# Patient Record
Sex: Male | Born: 1970 | Race: White | Hispanic: No | Marital: Married | Smoking: Never smoker
Health system: Southern US, Community
[De-identification: ages and names within clinical notes are randomized; demographics above are authoritative.]

## PROBLEM LIST (undated history)

## (undated) ENCOUNTER — Emergency Department (HOSPITAL_COMMUNITY): Admission: EM | Payer: BC Managed Care – PPO | Source: Home / Self Care

## (undated) DIAGNOSIS — F419 Anxiety disorder, unspecified: Secondary | ICD-10-CM

## (undated) DIAGNOSIS — K625 Hemorrhage of anus and rectum: Secondary | ICD-10-CM

## (undated) HISTORY — DX: Anxiety disorder, unspecified: F41.9

## (undated) HISTORY — DX: Hemorrhage of anus and rectum: K62.5

---

## 2008-10-19 ENCOUNTER — Emergency Department (HOSPITAL_COMMUNITY): Admission: EM | Admit: 2008-10-19 | Discharge: 2008-10-19 | Payer: Self-pay | Admitting: Emergency Medicine

## 2008-10-29 ENCOUNTER — Emergency Department (HOSPITAL_COMMUNITY): Admission: EM | Admit: 2008-10-29 | Discharge: 2008-10-29 | Payer: Self-pay | Admitting: Family Medicine

## 2011-07-16 ENCOUNTER — Emergency Department (HOSPITAL_COMMUNITY)
Admission: EM | Admit: 2011-07-16 | Discharge: 2011-07-16 | Disposition: A | Discharge status: Home / Self Care | Payer: BC Managed Care – PPO | Attending: Emergency Medicine | Admitting: Emergency Medicine

## 2011-07-16 ENCOUNTER — Emergency Department (HOSPITAL_COMMUNITY): Payer: BC Managed Care – PPO

## 2011-07-16 ENCOUNTER — Other Ambulatory Visit: Payer: Self-pay

## 2011-07-16 ENCOUNTER — Encounter: Payer: Self-pay | Admitting: *Deleted

## 2011-07-16 DIAGNOSIS — M549 Dorsalgia, unspecified: Secondary | ICD-10-CM

## 2011-07-16 DIAGNOSIS — M545 Low back pain, unspecified: Secondary | ICD-10-CM | POA: Insufficient documentation

## 2011-07-16 DIAGNOSIS — R079 Chest pain, unspecified: Secondary | ICD-10-CM | POA: Insufficient documentation

## 2011-07-16 LAB — POCT I-STAT, CHEM 8
Chloride: 102 mEq/L (ref 96–112)
Creatinine, Ser: 0.9 mg/dL (ref 0.50–1.35)
Glucose, Bld: 90 mg/dL (ref 70–99)
Hemoglobin: 15.6 g/dL (ref 13.0–17.0)
Potassium: 4 mEq/L (ref 3.5–5.1)

## 2011-07-16 MED ORDER — HYDROCODONE-ACETAMINOPHEN 5-325 MG PO TABS: 1.0000 | ORAL_TABLET | ORAL | Status: AC | PRN

## 2011-07-16 NOTE — ED Provider Notes (Signed)
History     CSN: [id number]  Arrival date & time 07/16/11  1040   First MD Initiated Contact with Patient 07/16/11 1136      Chief Complaint  Patient presents with  . Chest Pain    (Consider location/radiation/quality/duration/timing/severity/associated sxs/prior treatment) Patient is a 40 y.o. male presenting with chest pain. The history is provided by the patient.  Chest Pain The chest pain began 2 days ago. Chest pain occurs intermittently. The chest pain is unchanged. Associated with: He started taking Flexeril and Toradol 2 days ago prescribed by Urgent Care for back pain and has experienced intermittent chest pain since. Chest pain last night woke him from sleep and radiated into shoulder. The quality of the pain is described as sharp. Pertinent negatives for primary symptoms include no fever, no shortness of breath, no cough and no nausea.     History reviewed. No pertinent past medical history.  History reviewed. No pertinent past surgical history.  History reviewed. No pertinent family history.  History  Substance Use Topics  . Smoking status: Never Smoker   . Smokeless tobacco: Not on file  . Alcohol Use: Yes      Review of Systems  Constitutional: Negative for fever and chills.  HENT: Negative.   Respiratory: Negative.  Negative for cough and shortness of breath.   Cardiovascular: Positive for chest pain.  Gastrointestinal: Negative.  Negative for nausea.  Musculoskeletal: Negative.   Skin: Negative.   Neurological: Negative.     Allergies  Sulfa antibiotics  Home Medications   Current Outpatient Rx  Name Route Sig Dispense Refill  . VITAMIN C PO Oral Take 1 tablet by mouth daily.      . ADULT MULTIVITAMIN W/MINERALS CH Oral Take 1 tablet by mouth daily.        BP 121/80  Pulse 78  Temp(Src) 98 F (36.7 C) (Oral)  Resp 17  SpO2 100%  Physical Exam  Constitutional: He appears well-developed and well-nourished.  HENT:  Head: Normocephalic.   Neck: Normal range of motion. Neck supple. Carotid bruit is not present.  Cardiovascular: Normal rate and regular rhythm.   Pulmonary/Chest: Effort normal and breath sounds normal.  Abdominal: Soft. Bowel sounds are normal. There is no tenderness. There is no rebound and no guarding.  Musculoskeletal: Normal range of motion. He exhibits no edema and no tenderness.       Mild paralumbar tenderness without swelling or palpable spasm.  Neurological: He is alert. No cranial nerve deficit.  Skin: Skin is warm and dry. No rash noted.  Psychiatric: He has a normal mood and affect.    ED Course  Procedures (including critical care time)   Labs Reviewed  POCT I-STAT, CHEM 8  POCT I-STAT TROPONIN I  I-STAT, CHEM 8  I-STAT TROPONIN I   Dg Chest 2 View  07/16/2011  *RADIOLOGY REPORT*  Clinical Data: 40 year old male with left chest pain.  CHEST - 2 VIEW  Comparison: None  Findings: The cardiomediastinal silhouette is unremarkable. The lungs are clear. There is no evidence of focal airspace disease, pulmonary edema, pulmonary nodule/mass, pleural effusion, or pneumothorax. No acute bony abnormalities are identified.  IMPRESSION: No evidence of active cardiopulmonary disease.  Original Report Authenticated By: Rosendo Gros, M.D.     No diagnosis found.    MDM  Pain atypical for cardiac pain with no risk factors. Neg enzymes, normal EKG and CXR. He can be discharged home to follow up with his doctor.  Rodena Medin, PA 07/16/11 1243  Rodena Medin, PA 07/16/11 1245  Rodena Medin, PA 07/16/11 1246

## 2011-07-16 NOTE — ED Notes (Signed)
Pt had some back strain and went to an urgent care where he was placed on toradol and flexeril. He states that last night he had some intermittent left sided chest pain with some radiation down his left arm. He denies any other s/s. No other meds pta. Pt states that he was having such intense left sided chest pain last night and early this morning that it woke him from sleep. Pt read his drug information pamphlet which told him that if he experienced any of the above s/s that he should call prescribing md. He called md's office and they informed him to come here for evaluation. Alert and oriented. No acute distress noted.

## 2011-07-16 NOTE — ED Notes (Signed)
Pt reports he went to urgent care for back pain and was rx toradol and flexeril, states started taking on Monday and experienced intermittent left sided chest pain radiating into left arm. Pt reports pain woke him from his sleep. Denies any associated symptoms.

## 2011-07-16 NOTE — ED Provider Notes (Signed)
Medical screening examination/treatment/procedure(s) were performed by non-physician practitioner and as supervising physician I was immediately available for consultation/collaboration.  Nicholes Stairs, MD 07/16/11 2001

## 2013-01-02 IMAGING — CR DG CHEST 2V
2 series · 2 of 2 positions shown · non-contrast
Comparison: None

CLINICAL DATA: 40-year-old male with left chest pain.

CHEST - 2 VIEW

[w chest pa]
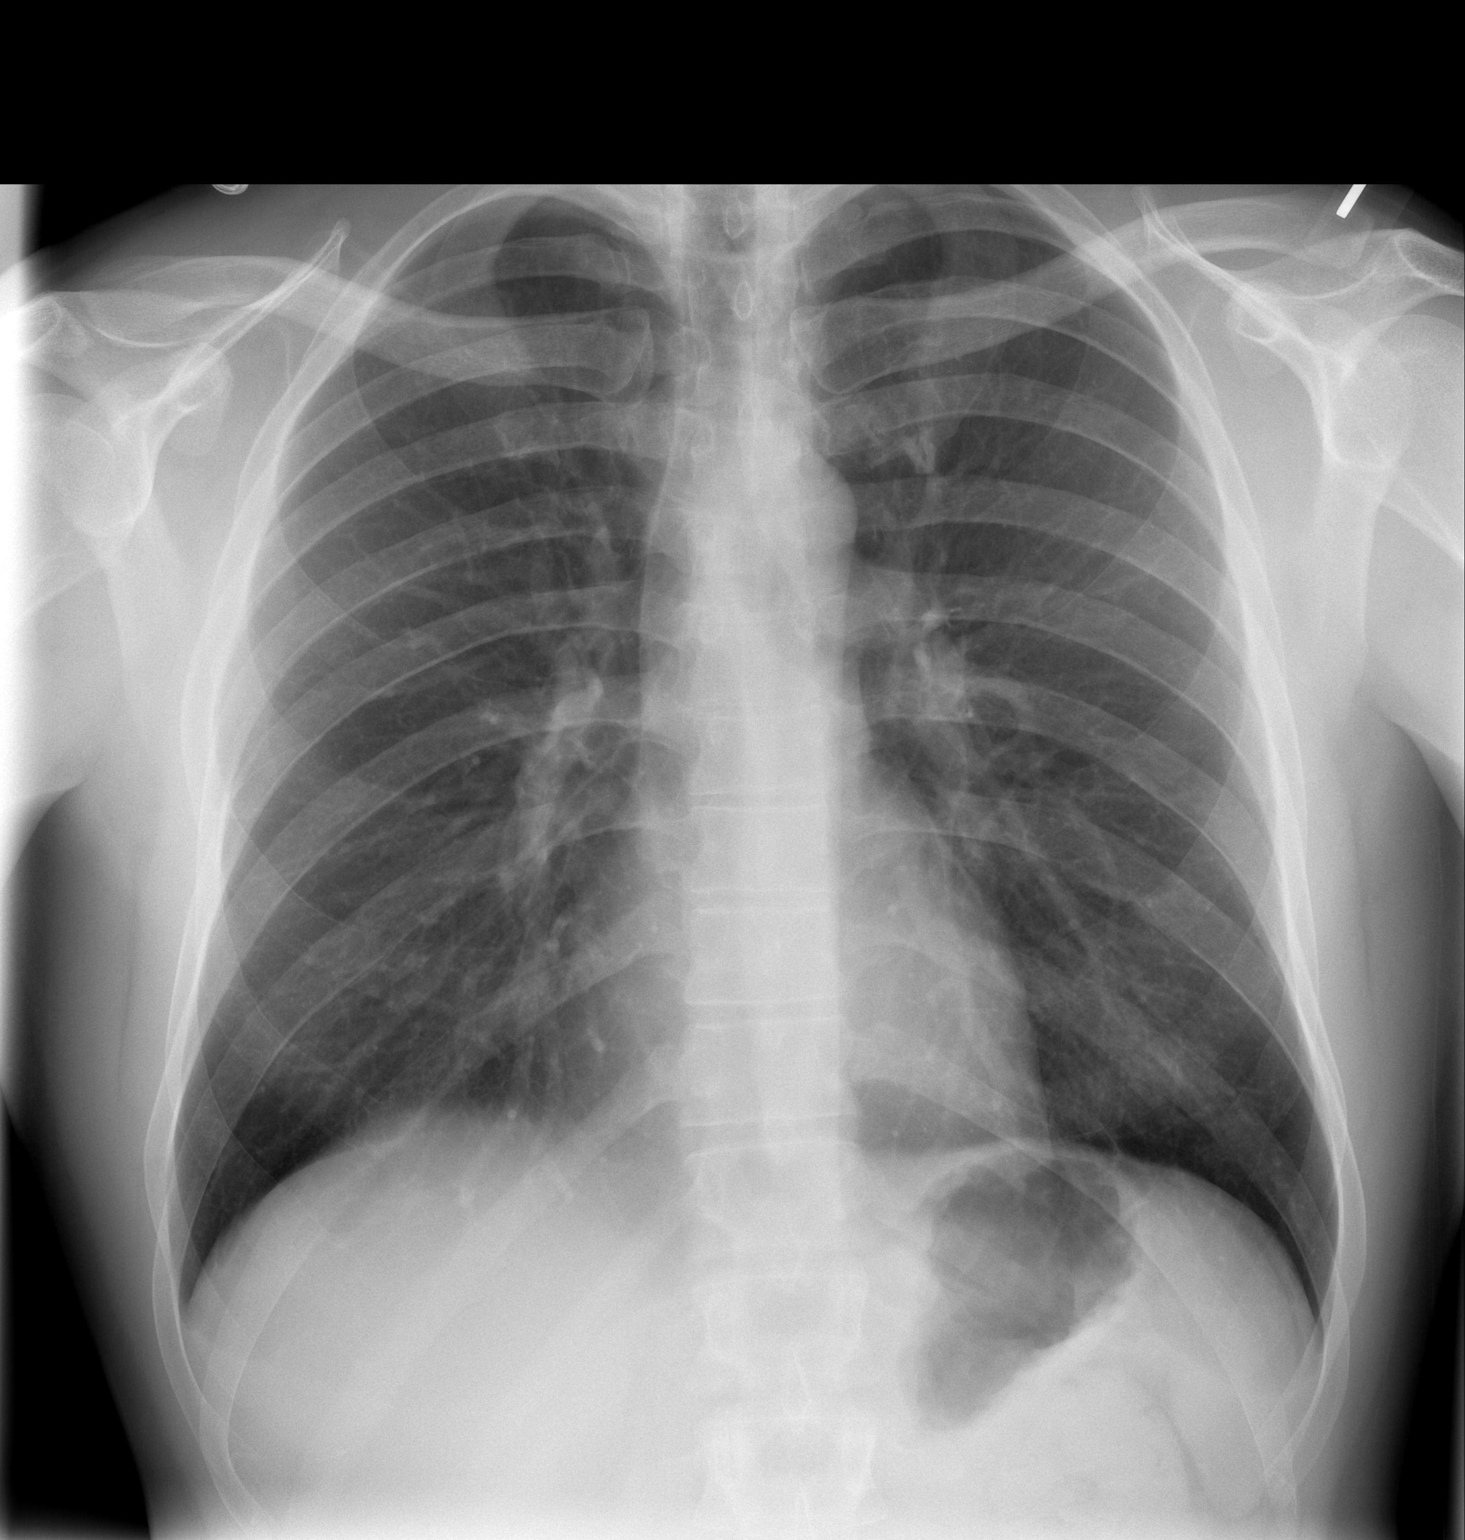

[w chest lat]
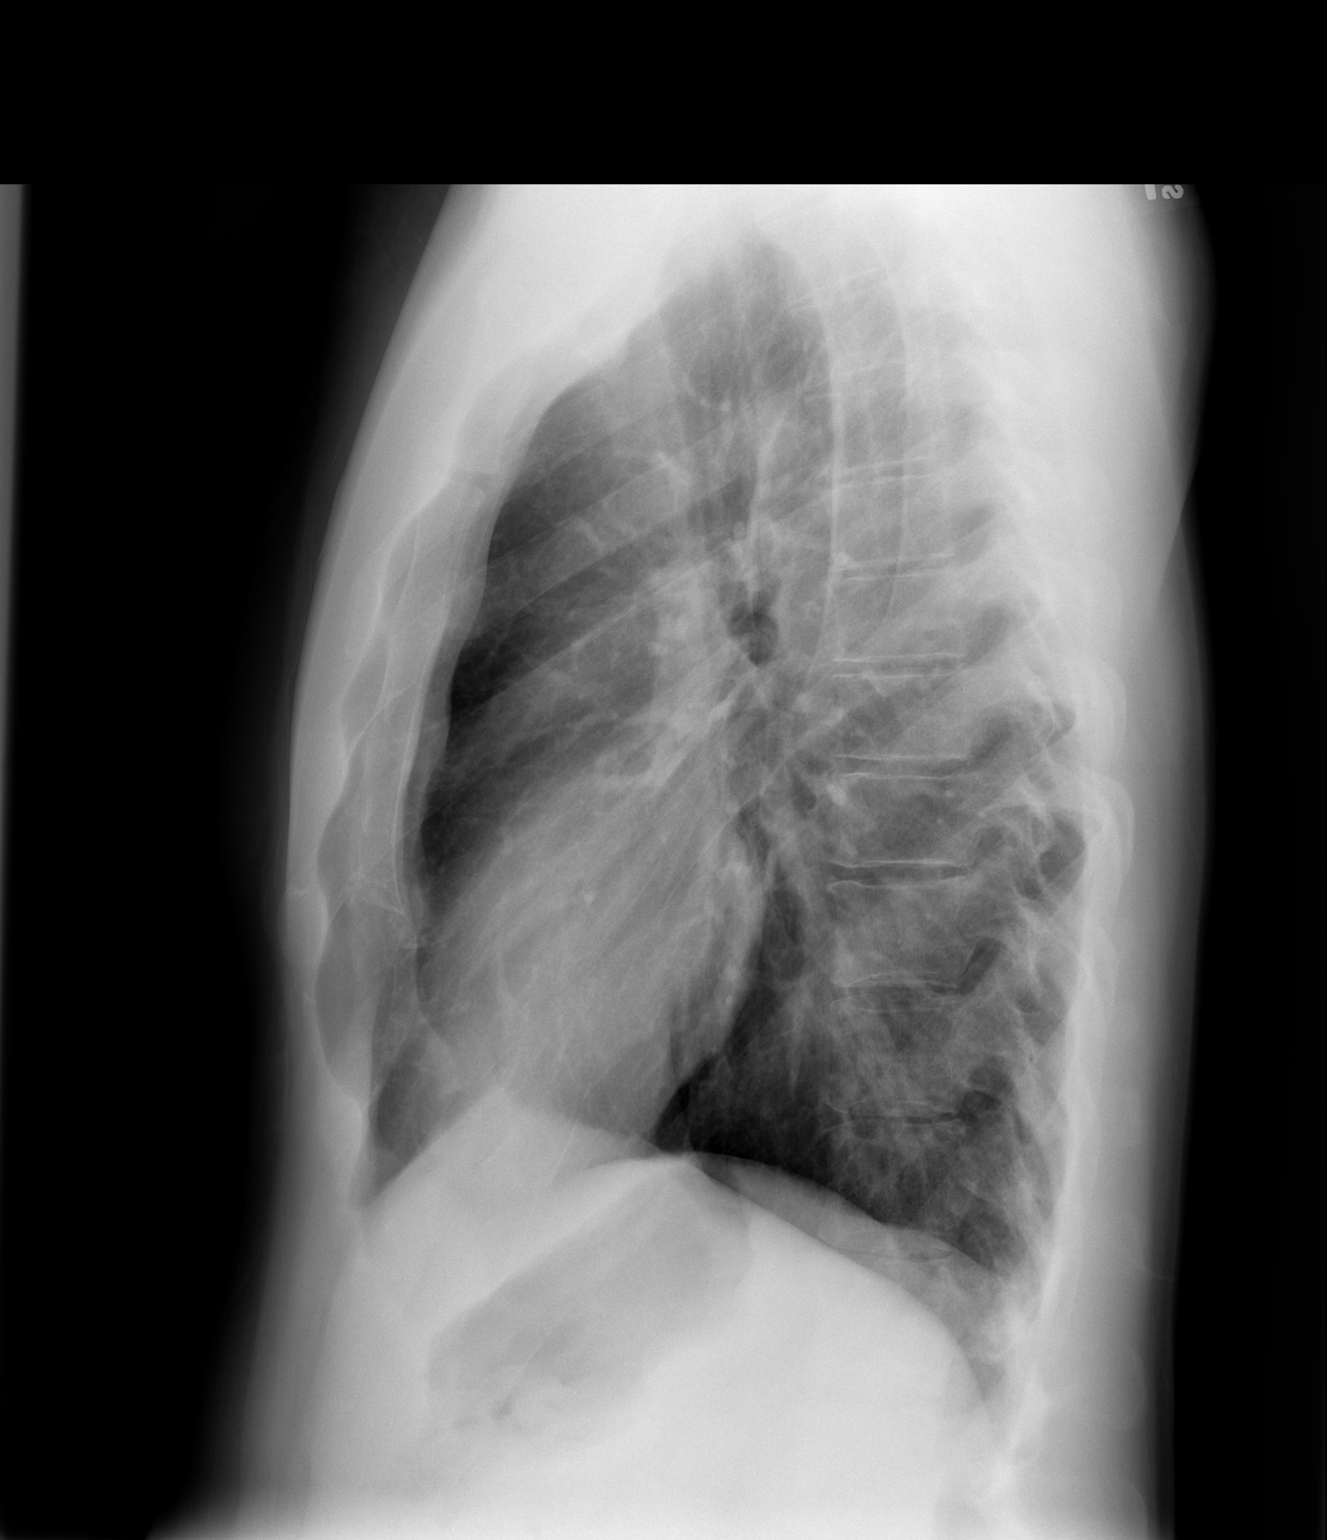

[2 of 2 positions shown; findings below may reference images not displayed]

FINDINGS: The cardiomediastinal silhouette is unremarkable.
The lungs are clear.
There is no evidence of focal airspace disease, pulmonary edema,
pulmonary nodule/mass, pleural effusion, or pneumothorax.
No acute bony abnormalities are identified.
IMPRESSION: No evidence of active cardiopulmonary disease.

## 2020-06-27 ENCOUNTER — Encounter: Payer: Self-pay | Admitting: Adult Health

## 2020-06-27 ENCOUNTER — Ambulatory Visit (INDEPENDENT_AMBULATORY_CARE_PROVIDER_SITE_OTHER): Payer: BC Managed Care – PPO | Admitting: Adult Health

## 2020-06-27 ENCOUNTER — Other Ambulatory Visit: Payer: Self-pay

## 2020-06-27 VITALS — BP 149/85 | HR 104 | Ht 72.0 in | Wt 160.0 lb

## 2020-06-27 DIAGNOSIS — F411 Generalized anxiety disorder: Secondary | ICD-10-CM | POA: Diagnosis not present

## 2020-06-27 MED ORDER — PROPRANOLOL HCL 10 MG PO TABS: 10.0000 mg | ORAL_TABLET | Freq: Three times a day (TID) | ORAL | 2 refills | Status: DC

## 2020-06-27 NOTE — Progress Notes (Signed)
[facility] MD/PA/NP Initial Note  06/27/2020 2:02 PM Walter Abbott  MRN:  [id number]  Chief Complaint:   HPI:   Describes mood today as "ok". Pleasant. Mood symptoms - reports some depression. Denies irritability. Feels more anxious overall. Having a difficult time with "body odor" in the work setting. Feels like it is related to anxiety. 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 Has always been challenged to speak in front of others. Has tried really hard to work through it. Has seen a counselor, psychologist, and family doctor with no remittance. The symptoms are predominantly in the work setting. Stating "it's embarrassing". When he gets nervous, he sweats and "stinks" of the office. Feels like his coworkers are Hydrologist" about it, but does not like putting them in that position. Stable interest and motivation. Taking medications as prescribed.  Energy levels stable. Active, does not have a regular exercise routine.  Enjoys some usual interests and activities. Married. Lives with wife of 15 years and 2 children. Spending time with family. Appetite adequate.  Weight stable - 160 pounds. Working on weight loss - 8 pounds. Sleeps well most nights. Averages 6.5 to 7 hours. "Focus and concentration stable - "borderline symptoms". Completing tasks. Managing aspects of household. Works full-time - 50 to 55 hours. Denies SI or HI.  Denies AH or VH. Diagnosed with ADHD in 2016 - trialed on Adderall.  Previous medication trials: Adderall   Visit Diagnosis:    ICD-10-CM   1. Generalized anxiety disorder  F41.1 propranolol (INDERAL) 10 MG tablet    Past Psychiatric History: Denies psychiatric hospitalization.  Past Medical History: No past medical history on file. No past surgical history on file.  Family Psychiatric History: Denies any family history of mental illness.  Family History: No family history on file.  Social History:  Social History   Socioeconomic History   . Marital status: Married    Spouse name: Not on file  . Number of children: Not on file  . Years of education: Not on file  . Highest education level: Not on file  Occupational History  . Not on file  Tobacco Use  . Smoking status: Never Smoker  . Smokeless tobacco: Never Used  Substance and Sexual Activity  . Alcohol use: Yes  . Drug use: Not on file  . Sexual activity: Not on file  Other Topics Concern  . Not on file  Social History Narrative  . Not on file   Social Determinants of Health   Financial Resource Strain: Not on file  Food Insecurity: Not on file  Transportation Needs: Not on file  Physical Activity: Not on file  Stress: Not on file  Social Connections: Not on file    Allergies:  Allergies  Allergen Reactions  . Sulfa Antibiotics Other (See Comments)    Family history of sulfa allergy    Metabolic Disorder Labs: No results found for: HGBA1C, MPG No results found for: PROLACTIN No results found for: CHOL, TRIG, HDL, CHOLHDL, VLDL, LDLCALC No results found for: TSH  Therapeutic Level Labs: No results found for: LITHIUM No results found for: VALPROATE No components found for:  CBMZ  Current Medications: Current Outpatient Medications  Medication Sig Dispense Refill  . Ascorbic Acid (VITAMIN C PO) Take 1 tablet by mouth daily.      . Multiple Vitamin (MULITIVITAMIN WITH MINERALS) TABS Take 1 tablet by mouth daily.      . propranolol (INDERAL) 10 MG tablet Take 1 tablet (10 mg total)  by mouth 3 (three) times daily. 90 tablet 2   No current facility-administered medications for this visit.    Medication Side Effects: none  Orders placed this visit:  No orders of the defined types were placed in this encounter.   Psychiatric Specialty Exam:  Review of Systems  Musculoskeletal: Negative for gait problem.  Neurological: Negative for tremors.  Psychiatric/Behavioral:       Please refer to HPI    Blood pressure (!) 149/85, pulse (!) 104,  height 6' (1.829 m), weight 160 lb (72.6 kg).Body mass index is 21.7 kg/m.  General Appearance: Casual, Neat and Well Groomed  Eye Contact:  Good  Speech:  Clear and Coherent and Normal Rate  Volume:  Normal  Mood:  Anxious and Depressed  Affect:  Appropriate and Congruent  Thought Process:  Coherent and Descriptions of Associations: Intact  Orientation:  Full (Time, Place, and Person)  Thought Content: Logical   Suicidal Thoughts:  No  Homicidal Thoughts:  No  Memory:  WNL  Judgement:  Good  Insight:  Good  Psychomotor Activity:  Normal  Concentration:  Concentration: Good  Recall:  Good  Fund of Knowledge: Good  Language: Good  Assets:  Communication Skills Desire for Improvement Financial Resources/Insurance Housing Intimacy Leisure Time Physical Health Resilience Social Support Talents/Skills Transportation Vocational/Educational  ADL's:  Intact  Cognition: WNL  Prognosis:  Good   Screenings:   Receiving Psychotherapy: No   Treatment Plan/Recommendations:   Plan:  PDMP reviewed  1. Add Propanolol 10mg  up to 3 times daily as needed for anxiety.  Consider Clonazepam  BP today 149/85 - 104  Read and reviewed note with patient for accuracy.   RTC 4 weeks  Patient advised to contact office with any questions, adverse effects, or acute worsening in signs and symptoms.      , NP

## 2020-07-06 ENCOUNTER — Other Ambulatory Visit: Payer: Self-pay

## 2020-07-06 DIAGNOSIS — Z20822 Contact with and (suspected) exposure to covid-19: Secondary | ICD-10-CM

## 2020-07-09 LAB — NOVEL CORONAVIRUS, NAA: SARS-CoV-2, NAA: NOT DETECTED

## 2020-07-25 ENCOUNTER — Encounter: Payer: Self-pay | Admitting: Adult Health

## 2020-07-25 ENCOUNTER — Other Ambulatory Visit: Payer: Self-pay

## 2020-07-25 ENCOUNTER — Ambulatory Visit (INDEPENDENT_AMBULATORY_CARE_PROVIDER_SITE_OTHER): Payer: BC Managed Care – PPO | Admitting: Adult Health

## 2020-07-25 DIAGNOSIS — F411 Generalized anxiety disorder: Secondary | ICD-10-CM

## 2020-07-25 MED ORDER — CLONAZEPAM 0.5 MG PO TABS: 0.5000 mg | ORAL_TABLET | Freq: Every day | ORAL | 2 refills | Status: DC | PRN

## 2020-07-25 NOTE — Progress Notes (Signed)
Walter Abbott [id number] 09/24/1970 50 y.o.  Subjective:   Patient ID:  Walter Abbott is a 50 y.o. (DOB 27-Jun-1971) male.  Chief Complaint: No chief complaint on file.   HPI Walter Abbott presents to the office today for follow-up of GAD.  Describes mood today as "ok". Pleasant. Mood symptoms - "some" depression. Denies irritability. Still feeling more anxious overall. Felt like the Propranolol was helpful, but did not tolerate due to GI issues. Continues to have "body odor" in the work setting - sometimes at home, but rare. Would like to try another medication to manage symptoms. Stable interest and motivation. Taking medications as prescribed.  Energy levels stable. Active, does not have a regular exercise routine.  Enjoys some usual interests and activities. Married. Lives with wife of 15 years and 2 children. Spending time with family. Appetite adequate.  Weight stable - 160 pounds.  Sleeps well most nights. Averages 6.5 to 7 hours. Focus and concentration stable - "having to work on it". Completing tasks. Managing aspects of household. Works full-time - 50 to 55 hours. Denies SI or HI.  Denies AH or VH. Diagnosed with ADHD in 2016 - trialed on Adderall.  Previous medication trials: Adderall, Propranolol - GI upset  Review of Systems:  Review of Systems  Musculoskeletal: Negative for gait problem.  Neurological: Negative for tremors.  Psychiatric/Behavioral:       Please refer to HPI    Medications: I have reviewed the patient's current medications.  Current Outpatient Medications  Medication Sig Dispense Refill  . clonazePAM (KLONOPIN) 0.5 MG tablet Take 1 tablet (0.5 mg total) by mouth daily as needed for anxiety. 30 tablet 2  . Ascorbic Acid (VITAMIN C PO) Take 1 tablet by mouth daily.      . Multiple Vitamin (MULITIVITAMIN WITH MINERALS) TABS Take 1 tablet by mouth daily.      . propranolol (INDERAL) 10 MG tablet Take 1 tablet (10 mg total) by mouth 3 (three)  times daily. 90 tablet 2   No current facility-administered medications for this visit.    Medication Side Effects: None  Allergies:  Allergies  Allergen Reactions  . Sulfa Antibiotics Other (See Comments)    Family history of sulfa allergy    No past medical history on file.  No family history on file.  Social History   Socioeconomic History  . Marital status: Married    Spouse name: Not on file  . Number of children: Not on file  . Years of education: Not on file  . Highest education level: Not on file  Occupational History  . Not on file  Tobacco Use  . Smoking status: Never Smoker  . Smokeless tobacco: Never Used  Substance and Sexual Activity  . Alcohol use: Yes  . Drug use: Not on file  . Sexual activity: Not on file  Other Topics Concern  . Not on file  Social History Narrative  . Not on file   Social Determinants of Health   Financial Resource Strain: Not on file  Food Insecurity: Not on file  Transportation Needs: Not on file  Physical Activity: Not on file  Stress: Not on file  Social Connections: Not on file  Intimate Partner Violence: Not on file    Past Medical History, Surgical history, Social history, and Family history were reviewed and updated as appropriate.   Please see review of systems for further details on the patient's review from today.   Objective:   Physical Exam:  There were no vitals taken for this visit.  Physical Exam Constitutional:      General: He is not in acute distress. Musculoskeletal:        General: No deformity.  Neurological:     Mental Status: He is alert and oriented to person, place, and time.     Coordination: Coordination normal.  Psychiatric:        Attention and Perception: Attention and perception normal. He does not perceive auditory or visual hallucinations.        Mood and Affect: Mood normal. Mood is not anxious or depressed. Affect is not labile, blunt, angry or inappropriate.        Speech:  Speech normal.        Behavior: Behavior normal.        Thought Content: Thought content normal. Thought content is not paranoid or delusional. Thought content does not include homicidal or suicidal ideation. Thought content does not include homicidal or suicidal plan.        Cognition and Memory: Cognition and memory normal.        Judgment: Judgment normal.     Comments: Insight intact     Lab Review:     Component Value Date/Time   NA 142 07/16/2011 1214   K 4.0 07/16/2011 1214   CL 102 07/16/2011 1214   GLUCOSE 90 07/16/2011 1214   BUN 17 07/16/2011 1214   CREATININE 0.90 07/16/2011 1214       Component Value Date/Time   HGB 15.6 07/16/2011 1214   HCT 46.0 07/16/2011 1214    No results found for: POCLITH, LITHIUM   No results found for: PHENYTOIN, PHENOBARB, VALPROATE, CBMZ   .res Assessment: Plan:    Plan:  PDMP reviewed  1. D/C Propanolol 10mg  up to 3 times daily as needed for anxiety. 2. Add Clonazepam 0.5mg  daily as needed.  BP today 149/85 - 104  Read and reviewed note with patient for accuracy.   RTC 4 weeks  Patient advised to contact office with any questions, adverse effects, or acute worsening in signs and symptoms.   Diagnoses and all orders for this visit:  Generalized anxiety disorder -     clonazePAM (KLONOPIN) 0.5 MG tablet; Take 1 tablet (0.5 mg total) by mouth daily as needed for anxiety.     Please see After Visit Summary for patient specific instructions.  No future appointments.  No orders of the defined types were placed in this encounter.   -------------------------------

## 2020-08-14 DIAGNOSIS — M545 Low back pain, unspecified: Secondary | ICD-10-CM | POA: Insufficient documentation

## 2020-08-22 ENCOUNTER — Telehealth (INDEPENDENT_AMBULATORY_CARE_PROVIDER_SITE_OTHER): Payer: BC Managed Care – PPO | Admitting: Adult Health

## 2020-08-22 ENCOUNTER — Encounter: Payer: Self-pay | Admitting: Adult Health

## 2020-08-22 DIAGNOSIS — F411 Generalized anxiety disorder: Secondary | ICD-10-CM

## 2020-08-22 MED ORDER — CLONAZEPAM 0.5 MG PO TABS: 0.5000 mg | ORAL_TABLET | Freq: Three times a day (TID) | ORAL | 2 refills | Status: DC | PRN

## 2020-08-22 NOTE — Progress Notes (Addendum)
Walter Abbott [id number] Apr 30, 1971 50 y.o.  Virtual Visit via Video Note  I connected with pt @ on 08/22/20 at  1:00 PM EST by a video enabled telemedicine application and verified that I am speaking with the correct person using two identifiers.   I discussed the limitations of evaluation and management by telemedicine and the availability of in person appointments. The patient expressed understanding and agreed to proceed.  I discussed the assessment and treatment plan with the patient. The patient was provided an opportunity to ask questions and all were answered. The patient agreed with the plan and demonstrated an understanding of the instructions.   The patient was advised to call back or seek an in-person evaluation if the symptoms worsen or if the condition fails to improve as anticipated.  I provided 20 minutes of non-face-to-face time during this encounter.  The patient was located at home.  The provider was located at [facility].   Dorothyann Gibbs, NP   Subjective:   Patient ID:  Walter Abbott is a 50 y.o. (DOB Aug 22, 1970) male.  Chief Complaint: No chief complaint on file.   HPI LANNIS LICHTENWALNER presents for follow-up of GAD.  Describes mood today as "ok". Pleasant. Mood symptoms - denies depression and irritability. Decreased anxiety with addition of Clonazepam. Doing better in the office setting. Still having issues with body odor, but feels it is a lot better. 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 Married. Lives with wife of 15 years and 2 children. Spending time with family. Appetite adequate.  Weight stable - 160 pounds.  Sleeps well most nights. Averages 6 to 7 hours. Focus and concentration "good". Completing tasks. Managing aspects of household. Works full-time - 50 to 55 hours. Denies SI or HI.  Denies AH or VH. Diagnosed with ADHD in 2016 - trialed on Adderall.  Previous medication trials: Adderall, Propranolol - GI  upset    Review of Systems:  Review of Systems  Musculoskeletal: Negative for gait problem.  Neurological: Negative for tremors.  Psychiatric/Behavioral:       Please refer to HPI    Medications: I have reviewed the patient's current medications.  Current Outpatient Medications  Medication Sig Dispense Refill  . Ascorbic Acid (VITAMIN C PO) Take 1 tablet by mouth daily.      . clonazePAM (KLONOPIN) 0.5 MG tablet Take 1 tablet (0.5 mg total) by mouth 3 (three) times daily as needed for anxiety. 90 tablet 2  . Multiple Vitamin (MULITIVITAMIN WITH MINERALS) TABS Take 1 tablet by mouth daily.      . propranolol (INDERAL) 10 MG tablet Take 1 tablet (10 mg total) by mouth 3 (three) times daily. 90 tablet 2   No current facility-administered medications for this visit.    Medication Side Effects: None  Allergies:  Allergies  Allergen Reactions  . Sulfa Antibiotics Other (See Comments)    Family history of sulfa allergy    No past medical history on file.  No family history on file.  Social History   Socioeconomic History  . Marital status: Married    Spouse name: Not on file  . Number of children: Not on file  . Years of education: Not on file  . Highest education level: Not on file  Occupational History  . Not on file  Tobacco Use  . Smoking status: Never Smoker  . Smokeless tobacco: Never Used  Substance and Sexual Activity  . Alcohol use: Yes  . Drug  use: Not on file  . Sexual activity: Not on file  Other Topics Concern  . Not on file  Social History Narrative  . Not on file   Social Determinants of Health   Financial Resource Strain: Not on file  Food Insecurity: Not on file  Transportation Needs: Not on file  Physical Activity: Not on file  Stress: Not on file  Social Connections: Not on file  Intimate Partner Violence: Not on file    Past Medical History, Surgical history, Social history, and Family history were reviewed and updated as appropriate.    Please see review of systems for further details on the patient's review from today.   Objective:   Physical Exam:  There were no vitals taken for this visit.  Physical Exam Constitutional:      General: He is not in acute distress. Musculoskeletal:        General: No deformity.  Neurological:     Mental Status: He is alert and oriented to person, place, and time.     Coordination: Coordination normal.  Psychiatric:        Attention and Perception: Attention and perception normal. He does not perceive auditory or visual hallucinations.        Mood and Affect: Mood normal. Mood is not anxious or depressed. Affect is not labile, blunt, angry or inappropriate.        Speech: Speech normal.        Behavior: Behavior normal.        Thought Content: Thought content normal. Thought content is not paranoid or delusional. Thought content does not include homicidal or suicidal ideation. Thought content does not include homicidal or suicidal plan.        Cognition and Memory: Cognition and memory normal.        Judgment: Judgment normal.     Comments: Insight intact     Lab Review:     Component Value Date/Time   NA 142 07/16/2011 1214   K 4.0 07/16/2011 1214   CL 102 07/16/2011 1214   GLUCOSE 90 07/16/2011 1214   BUN 17 07/16/2011 1214   CREATININE 0.90 07/16/2011 1214       Component Value Date/Time   HGB 15.6 07/16/2011 1214   HCT 46.0 07/16/2011 1214    No results found for: POCLITH, LITHIUM   No results found for: PHENYTOIN, PHENOBARB, VALPROATE, CBMZ   .res Assessment: Plan:    Plan:  PDMP reviewed  1. Increase Clonazepam 0.5mg  daily to tid as needed.  RTC 4 weeks  Patient advised to contact office with any questions, adverse effects, or acute worsening in signs and symptoms.  Discussed potential benefits, risk, and side effects of benzodiazepines to include potential risk of tolerance and dependence, as well as possible drowsiness.  Advised patient not to  drive if experiencing drowsiness and to take lowest possible effective dose to minimize risk of dependence and tolerance.  Diagnoses and all orders for this visit:  Generalized anxiety disorder -     clonazePAM (KLONOPIN) 0.5 MG tablet; Take 1 tablet (0.5 mg total) by mouth 3 (three) times daily as needed for anxiety.     Please see After Visit Summary for patient specific instructions.  No future appointments.  No orders of the defined types were placed in this encounter.     -------------------------------

## 2020-11-25 ENCOUNTER — Encounter: Payer: Self-pay | Admitting: Adult Health

## 2020-11-25 ENCOUNTER — Other Ambulatory Visit: Payer: Self-pay

## 2020-11-25 ENCOUNTER — Ambulatory Visit (INDEPENDENT_AMBULATORY_CARE_PROVIDER_SITE_OTHER): Payer: BC Managed Care – PPO | Admitting: Adult Health

## 2020-11-25 DIAGNOSIS — F411 Generalized anxiety disorder: Secondary | ICD-10-CM | POA: Diagnosis not present

## 2020-11-25 MED ORDER — FLUOXETINE HCL 20 MG PO CAPS: 20.0000 mg | ORAL_CAPSULE | Freq: Every day | ORAL | 5 refills | Status: DC

## 2020-11-25 NOTE — Progress Notes (Signed)
NYCHOLAS RAYNER [id number] 29-Sep-1970 50 y.o.  Subjective:   Patient ID:  Walter Abbott is a 50 y.o. (DOB 12-19-1970) male.  Chief Complaint: No chief complaint on file.   HPI Walter Abbott presents to the office today for follow-up of GAD.  Describes mood today as "ok". Pleasant. Mood symptoms - denies depression and irritability. Decreased anxiety with Clonazepam. Feeling too sedated on Clonazepam with regular use and would like to start a longer acting medication. Will possibly be changing jobs or returning to the office. He and father talking - has been out of his life for 11 years. Planning to meet later this year. Spending time with family. Appetite adequate.  Weight stable - 160 pounds.  Sleeps well most nights. Averages 6 to 7 hours. Focus and concentration stable. Completing tasks. Managing aspects of household. Works full-time - 45 to 50 hours. Denies SI or HI.  Denies AH or VH. Diagnosed with ADHD in 2016 - trialed on Adderall.  Previous medication trials: Adderall, Propranolol - GI upset,    Review of Systems:  Review of Systems  Musculoskeletal: Negative for gait problem.  Neurological: Negative for tremors.  Psychiatric/Behavioral:       Please refer to HPI    Medications: I have reviewed the patient's current medications.  Current Outpatient Medications  Medication Sig Dispense Refill  . FLUoxetine (PROZAC) 20 MG capsule Take 1 capsule (20 mg total) by mouth daily. 30 capsule 5  . Ascorbic Acid (VITAMIN C PO) Take 1 tablet by mouth daily.      . clonazePAM (KLONOPIN) 0.5 MG tablet Take 1 tablet (0.5 mg total) by mouth 3 (three) times daily as needed for anxiety. 90 tablet 2  . Multiple Vitamin (MULITIVITAMIN WITH MINERALS) TABS Take 1 tablet by mouth daily.      . propranolol (INDERAL) 10 MG tablet Take 1 tablet (10 mg total) by mouth 3 (three) times daily. 90 tablet 2   No current facility-administered medications for this visit.    Medication Side  Effects: None  Allergies:  Allergies  Allergen Reactions  . Sulfa Antibiotics Other (See Comments)    Family history of sulfa allergy    No past medical history on file.  Past Medical History, Surgical history, Social history, and Family history were reviewed and updated as appropriate.   Please see review of systems for further details on the patient's review from today.   Objective:   Physical Exam:  There were no vitals taken for this visit.  Physical Exam Constitutional:      General: He is not in acute distress. Musculoskeletal:        General: No deformity.  Neurological:     Mental Status: He is alert and oriented to person, place, and time.     Coordination: Coordination normal.  Psychiatric:        Attention and Perception: Attention and perception normal. He does not perceive auditory or visual hallucinations.        Mood and Affect: Mood normal. Mood is not anxious or depressed. Affect is not labile, blunt, angry or inappropriate.        Speech: Speech normal.        Behavior: Behavior normal.        Thought Content: Thought content normal. Thought content is not paranoid or delusional. Thought content does not include homicidal or suicidal ideation. Thought content does not include homicidal or suicidal plan.        Cognition and Memory: Cognition  and memory normal.        Judgment: Judgment normal.     Comments: Insight intact     Lab Review:     Component Value Date/Time   NA 142 07/16/2011 1214   K 4.0 07/16/2011 1214   CL 102 07/16/2011 1214   GLUCOSE 90 07/16/2011 1214   BUN 17 07/16/2011 1214   CREATININE 0.90 07/16/2011 1214       Component Value Date/Time   HGB 15.6 07/16/2011 1214   HCT 46.0 07/16/2011 1214    No results found for: POCLITH, LITHIUM   No results found for: PHENYTOIN, PHENOBARB, VALPROATE, CBMZ   .res Assessment: Plan:    Plan:  PDMP reviewed  1.Continue Clonazepam 0.5mg  daily to tid as needed. 2. Add Prozac 20mg   daily  RTC 6/8 weeks  Patient advised to contact office with any questions, adverse effects, or acute worsening in signs and symptoms.  Discussed potential benefits, risk, and side effects of benzodiazepines to include potential risk of tolerance and dependence, as well as possible drowsiness.  Advised patient not to drive if experiencing drowsiness and to take lowest possible effective dose to minimize risk of dependence and tolerance.   Diagnoses and all orders for this visit:  Generalized anxiety disorder -     FLUoxetine (PROZAC) 20 MG capsule; Take 1 capsule (20 mg total) by mouth daily.     Please see After Visit Summary for patient specific instructions.  Future Appointments  Date Time Provider Department Center  01/06/2021  9:40 AM Jermie Hippe, 01/08/2021, NP [facility] None    No orders of the defined types were placed in this encounter.   -------------------------------

## 2020-12-05 ENCOUNTER — Other Ambulatory Visit: Payer: Self-pay

## 2020-12-05 ENCOUNTER — Encounter: Payer: Self-pay | Admitting: Podiatry

## 2020-12-05 ENCOUNTER — Ambulatory Visit (INDEPENDENT_AMBULATORY_CARE_PROVIDER_SITE_OTHER): Payer: BLUE CROSS/BLUE SHIELD | Admitting: Podiatry

## 2020-12-05 DIAGNOSIS — B351 Tinea unguium: Secondary | ICD-10-CM | POA: Diagnosis not present

## 2020-12-05 MED ORDER — CICLOPIROX 8 % EX SOLN: Freq: Every day | CUTANEOUS | 4 refills | Status: DC

## 2020-12-05 MED ORDER — TERBINAFINE HCL 250 MG PO TABS: 250.0000 mg | ORAL_TABLET | Freq: Every day | ORAL | 0 refills | Status: AC

## 2020-12-05 NOTE — Progress Notes (Signed)
  Subjective:  Patient ID: Walter Abbott, male    DOB: Oct 08, 1970,  MRN: [id number]  Chief Complaint  Patient presents with  . Nail Problem     (np) Toe fungus; follow up to telehealth prescription.  Has been taking Terbinafine for 30 days.    50 y.o. male presents with the above complaint. History confirmed with patient.  Has been on Lamisil for 1 month from his PCP  Objective:  Physical Exam: warm, good capillary refill, no trophic changes or ulcerative lesions, normal DP and PT pulses and normal sensory exam.  Dystrophic mycotic nails with yellowed cracking nail plates bilateral hallux     Assessment:   1. Onychomycosis      Plan:  Patient was evaluated and treated and all questions answered.  Discussed etiology treatment options of nail fungus including laser, topical and oral treatments.  I recommended oral and topical treatment.  Has been on Lamisil for 1 month I prescribed him another 2 months of this.  We will see him in 3 months for follow-up, photographs were taken.  Ciclopirox also prescribed.  If it fails second round of Lamisil we will consider laser therapy.  Return in about 3 months (around 03/07/2021) for nail fungus re-check.

## 2021-01-06 ENCOUNTER — Other Ambulatory Visit: Payer: Self-pay

## 2021-01-06 ENCOUNTER — Encounter: Payer: Self-pay | Admitting: Adult Health

## 2021-01-06 ENCOUNTER — Ambulatory Visit (INDEPENDENT_AMBULATORY_CARE_PROVIDER_SITE_OTHER): Payer: BC Managed Care – PPO | Admitting: Adult Health

## 2021-01-06 DIAGNOSIS — F411 Generalized anxiety disorder: Secondary | ICD-10-CM

## 2021-01-06 NOTE — Progress Notes (Signed)
Walter Abbott [id number] 04/01/1971 50 y.o.  Subjective:   Patient ID:  Walter Abbott is a 50 y.o. (DOB 04-15-1971) male.  Chief Complaint: No chief complaint on file.   HPI Walter Abbott presents to the office today for follow-up of GAD.  Describes mood today as "ok". Pleasant. Mood symptoms - denies depression and irritability. Decreased anxiety with Clonazepam and addition of Prozac. Wife has noticed a difference - texted "he seems happier, smiles more, calm and relaxed, visibly less anxiousness, more positive, more engaged with family". Ge feels happier overall. Still having issues with body odor on days he's in the office - 1 day a week. Stating "it is a little bit better". Taking Clonazepam less than he was. Considering a job change - not comfortable with being remote all the time. Concerned about his current "issue".Continues to talk with father - "a little rocky for a few months - going well now". Spending time with family. Appetite adequate.  Weight stable - 160 pounds.   Sleeps well most nights. Averages 7 to 8 hours. Focus and concentration stable. Completing tasks. Managing aspects of household. Works full-time - 45 to 50 hours. Denies SI or HI.  Denies AH or VH. Diagnosed with ADHD in 2016 - trialed on Adderall.  Previous medication trials: Adderall, Propranolol - GI upset,     Review of Systems:  Review of Systems  Musculoskeletal:  Negative for gait problem.  Neurological:  Negative for tremors.  Psychiatric/Behavioral:         Please refer to HPI   Medications: I have reviewed the patient's current medications.  Current Outpatient Medications  Medication Sig Dispense Refill   Ascorbic Acid (VITAMIN C PO) Take 1 tablet by mouth daily.       ciclopirox (PENLAC) 8 % solution Apply topically at bedtime. Apply over nail and surrounding skin. Apply daily over previous coat. After seven (7) days, may remove with alcohol and continue cycle. 6.6 mL 4   clonazePAM  (KLONOPIN) 0.5 MG tablet Take 1 tablet (0.5 mg total) by mouth 3 (three) times daily as needed for anxiety. 90 tablet 2   FLUoxetine (PROZAC) 20 MG capsule Take 1 capsule (20 mg total) by mouth daily. 30 capsule 5   Multiple Vitamin (MULITIVITAMIN WITH MINERALS) TABS Take 1 tablet by mouth daily.       terbinafine (LAMISIL) 250 MG tablet Take 1 tablet (250 mg total) by mouth daily. 60 tablet 0   No current facility-administered medications for this visit.    Medication Side Effects: None  Allergies:  Allergies  Allergen Reactions   Sulfa Antibiotics Other (See Comments)    Family history of sulfa allergy    No past medical history on file.  Past Medical History, Surgical history, Social history, and Family history were reviewed and updated as appropriate.   Please see review of systems for further details on the patient's review from today.   Objective:   Physical Exam:  There were no vitals taken for this visit.  Physical Exam Constitutional:      General: He is not in acute distress. Musculoskeletal:        General: No deformity.  Neurological:     Mental Status: He is alert and oriented to person, place, and time.     Coordination: Coordination normal.  Psychiatric:        Attention and Perception: Attention and perception normal. He does not perceive auditory or visual hallucinations.  Mood and Affect: Mood normal. Mood is not anxious or depressed. Affect is not labile, blunt, angry or inappropriate.        Speech: Speech normal.        Behavior: Behavior normal.        Thought Content: Thought content normal. Thought content is not paranoid or delusional. Thought content does not include homicidal or suicidal ideation. Thought content does not include homicidal or suicidal plan.        Cognition and Memory: Cognition and memory normal.        Judgment: Judgment normal.     Comments: Insight intact    Lab Review:     Component Value Date/Time   NA 142  07/16/2011 1214   K 4.0 07/16/2011 1214   CL 102 07/16/2011 1214   GLUCOSE 90 07/16/2011 1214   BUN 17 07/16/2011 1214   CREATININE 0.90 07/16/2011 1214       Component Value Date/Time   HGB 15.6 07/16/2011 1214   HCT 46.0 07/16/2011 1214    No results found for: POCLITH, LITHIUM   No results found for: PHENYTOIN, PHENOBARB, VALPROATE, CBMZ   .res Assessment: Plan:    Plan:  PDMP reviewed  1.Continue Clonazepam 0.5mg  daily to tid as needed. 2. Continue Prozac 20mg  daily  RTC 6/8 weeks  Patient advised to contact office with any questions, adverse effects, or acute worsening in signs and symptoms.  Discussed potential benefits, risk, and side effects of benzodiazepines to include potential risk of tolerance and dependence, as well as possible drowsiness.  Advised patient not to drive if experiencing drowsiness and to take lowest possible effective dose to minimize risk of dependence and tolerance.   Diagnoses and all orders for this visit:  Generalized anxiety disorder    Please see After Visit Summary for patient specific instructions.  Future Appointments  Date Time Provider Department Center  03/11/2021 10:15 AM McDonald, 03/13/2021, DPM [facility]    No orders of the defined types were placed in this encounter.   -------------------------------

## 2021-02-17 ENCOUNTER — Encounter: Payer: Self-pay | Admitting: Adult Health

## 2021-02-17 ENCOUNTER — Ambulatory Visit (INDEPENDENT_AMBULATORY_CARE_PROVIDER_SITE_OTHER): Payer: BC Managed Care – PPO | Admitting: Adult Health

## 2021-02-17 DIAGNOSIS — F411 Generalized anxiety disorder: Secondary | ICD-10-CM

## 2021-02-17 MED ORDER — CLONAZEPAM 0.5 MG PO TABS: 0.5000 mg | ORAL_TABLET | Freq: Three times a day (TID) | ORAL | 2 refills | Status: DC | PRN

## 2021-02-17 MED ORDER — FLUOXETINE HCL 40 MG PO CAPS: 40.0000 mg | ORAL_CAPSULE | Freq: Every day | ORAL | 2 refills | Status: DC

## 2021-02-17 NOTE — Progress Notes (Signed)
Walter Abbott [id number] Dec 23, 1970 50 y.o.  Virtual Visit via Telephone Note  I connected with pt on 02/17/21 at  9:40 AM EDT by telephone and verified that I am speaking with the correct person using two identifiers.   I discussed the limitations, risks, security and privacy concerns of performing an evaluation and management service by telephone and the availability of in person appointments. I also discussed with the patient that there may be a patient responsible charge related to this service. The patient expressed understanding and agreed to proceed.   I discussed the assessment and treatment plan with the patient. The patient was provided an opportunity to ask questions and all were answered. The patient agreed with the plan and demonstrated an understanding of the instructions.   The patient was advised to call back or seek an in-person evaluation if the symptoms worsen or if the condition fails to improve as anticipated.  I provided 10 minutes of non-face-to-face time during this encounter.  The patient was located at home.  The provider was located at [facility].   Dorothyann Gibbs, NP   Subjective:   Patient ID:  Walter Abbott is a 50 y.o. (DOB [birth year]) male.  Chief Complaint: No chief complaint on file.   HPI Walter Abbott presents for follow-up of GAD.  Describes mood today as "ok". Pleasant. Mood symptoms - denies depression and irritability. Decreased anxiety with Clonazepam and addition of Prozac. Would like to increase Prozac 20mg  to 40mg . Has given the 20mg  dose 12 weeks but still feels anxious at times. Continues to have issues with body odor in the office setting and would like to increase dose. Has felt better "emotionally" with the Prozac. Family recovering from Covid. Spending time with family. Energy levels stable. Active, does not have a regular exercise routine.  Enjoys some usual interests and activities. Married. Lives with wife of  15 years and 2 children. Spending time with family. Appetite adequate.  Weight stable - 160 pounds.  Sleeps well most nights. Averages 8 hours. Focus and concentration stable. Completing tasks. Managing aspects of household. Works full-time - 45 to 50 hours. Denies SI or HI.  Denies AH or VH. Diagnosed with ADHD in 2016 - trialed on Adderall.  Previous medication trials: Adderall, Propranolol - GI upset,      Review of Systems:  Review of Systems  Musculoskeletal:  Negative for gait problem.  Neurological:  Negative for tremors.  Psychiatric/Behavioral:         Please refer to HPI   Medications: I have reviewed the patient's current medications.  Current Outpatient Medications  Medication Sig Dispense Refill   Ascorbic Acid (VITAMIN C PO) Take 1 tablet by mouth daily.       ciclopirox (PENLAC) 8 % solution Apply topically at bedtime. Apply over nail and surrounding skin. Apply daily over previous coat. After seven (7) days, may remove with alcohol and continue cycle. 6.6 mL 4   clonazePAM (KLONOPIN) 0.5 MG tablet Take 1 tablet (0.5 mg total) by mouth 3 (three) times daily as needed for anxiety. 90 tablet 2   FLUoxetine (PROZAC) 40 MG capsule Take 1 capsule (40 mg total) by mouth daily. 30 capsule 2   Multiple Vitamin (MULITIVITAMIN WITH MINERALS) TABS Take 1 tablet by mouth daily.       No current facility-administered medications for this visit.    Medication Side Effects: None  Allergies:  Allergies  Allergen Reactions   Sulfa Antibiotics Other (See Comments)  Family history of sulfa allergy    No past medical history on file.  No family history on file.  Social History   Socioeconomic History   Marital status: Married    Spouse name: Not on file   Number of children: Not on file   Years of education: Not on file   Highest education level: Not on file  Occupational History   Not on file  Tobacco Use   Smoking status: Never   Smokeless tobacco: Never   Substance and Sexual Activity   Alcohol use: Yes   Drug use: Not on file   Sexual activity: Not on file  Other Topics Concern   Not on file  Social History Narrative   Not on file   Social Determinants of Health   Financial Resource Strain: Not on file  Food Insecurity: Not on file  Transportation Needs: Not on file  Physical Activity: Not on file  Stress: Not on file  Social Connections: Not on file  Intimate Partner Violence: Not on file    Past Medical History, Surgical history, Social history, and Family history were reviewed and updated as appropriate.   Please see review of systems for further details on the patient's review from today.   Objective:   Physical Exam:  There were no vitals taken for this visit.  Physical Exam Neurological:     Mental Status: He is alert and oriented to person, place, and time.     Cranial Nerves: No dysarthria.  Psychiatric:        Attention and Perception: Attention and perception normal.        Mood and Affect: Mood normal.        Speech: Speech normal.        Behavior: Behavior is cooperative.        Thought Content: Thought content normal. Thought content is not paranoid or delusional. Thought content does not include homicidal or suicidal ideation. Thought content does not include homicidal or suicidal plan.        Cognition and Memory: Cognition and memory normal.        Judgment: Judgment normal.     Comments: Insight intact    Lab Review:     Component Value Date/Time   NA 142 07/16/2011 1214   K 4.0 07/16/2011 1214   CL 102 07/16/2011 1214   GLUCOSE 90 07/16/2011 1214   BUN 17 07/16/2011 1214   CREATININE 0.90 07/16/2011 1214       Component Value Date/Time   HGB 15.6 07/16/2011 1214   HCT 46.0 07/16/2011 1214    No results found for: POCLITH, LITHIUM   No results found for: PHENYTOIN, PHENOBARB, VALPROATE, CBMZ   .res Assessment: Plan:    Plan:  PDMP reviewed  Continue Clonazepam 0.5mg  daily to  tid as needed. Increase Prozac 20mg  to 40mg  daily  RTC 6/8 weeks  Patient advised to contact office with any questions, adverse effects, or acute worsening in signs and symptoms.  Discussed potential benefits, risk, and side effects of benzodiazepines to include potential risk of tolerance and dependence, as well as possible drowsiness.  Advised patient not to drive if experiencing drowsiness and to take lowest possible effective dose to minimize risk of dependence and tolerance.  Diagnoses and all orders for this visit:  Generalized anxiety disorder -     FLUoxetine (PROZAC) 40 MG capsule; Take 1 capsule (40 mg total) by mouth daily. -     clonazePAM (KLONOPIN) 0.5 MG tablet; Take  1 tablet (0.5 mg total) by mouth 3 (three) times daily as needed for anxiety.   Please see After Visit Summary for patient specific instructions.  Future Appointments  Date Time Provider Department Center  03/11/2021 10:15 AM McDonald, Rachelle Hora, DPM [facility]    No orders of the defined types were placed in this encounter.     -------------------------------

## 2021-02-17 NOTE — Addendum Note (Signed)
Addended by: Dorothyann Gibbs on: 02/17/2021 10:10 AM   Modules accepted: Level of Service

## 2021-03-11 ENCOUNTER — Ambulatory Visit: Payer: BC Managed Care – PPO | Admitting: Podiatry

## 2021-03-17 ENCOUNTER — Ambulatory Visit (INDEPENDENT_AMBULATORY_CARE_PROVIDER_SITE_OTHER): Payer: BC Managed Care – PPO | Admitting: Podiatry

## 2021-03-17 ENCOUNTER — Other Ambulatory Visit: Payer: Self-pay

## 2021-03-17 DIAGNOSIS — B351 Tinea unguium: Secondary | ICD-10-CM

## 2021-03-17 MED ORDER — TERBINAFINE HCL 250 MG PO TABS: 250.0000 mg | ORAL_TABLET | Freq: Every day | ORAL | 0 refills | Status: AC

## 2021-03-19 NOTE — Progress Notes (Signed)
  Subjective:  Patient ID: Walter Abbott, male    DOB: [birth year],  MRN: [id number]  Chief Complaint  Patient presents with   Nail Problem    3 month follow up, thick painful toenails    50 y.o. male presents with the above complaint. History confirmed with patient.  Doing well no side effects from Lamisil has had about 50% improvement  Objective:  Physical Exam: warm, good capillary refill, no trophic changes or ulcerative lesions, normal DP and PT pulses and normal sensory exam.  Dystrophic mycotic nails with yellowed cracking nail plates bilateral hallux 50% proximal clearing     Assessment:   1. Onychomycosis      Plan:  Patient was evaluated and treated and all questions answered.  Doing well and responded well to Lamisil treatment.  I recommend another 3 months and this was sent to his pharmacy.  He will follow with me after this and a 1 month break  Return in about 4 months (around 07/29/2021).

## 2021-04-21 ENCOUNTER — Ambulatory Visit (INDEPENDENT_AMBULATORY_CARE_PROVIDER_SITE_OTHER): Payer: BC Managed Care – PPO | Admitting: Adult Health

## 2021-04-21 ENCOUNTER — Encounter: Payer: Self-pay | Admitting: Adult Health

## 2021-04-21 ENCOUNTER — Other Ambulatory Visit: Payer: Self-pay

## 2021-04-21 DIAGNOSIS — F200 Paranoid schizophrenia: Secondary | ICD-10-CM | POA: Diagnosis not present

## 2021-04-21 DIAGNOSIS — F411 Generalized anxiety disorder: Secondary | ICD-10-CM

## 2021-04-21 MED ORDER — CLONAZEPAM 0.5 MG PO TABS: 0.5000 mg | ORAL_TABLET | Freq: Three times a day (TID) | ORAL | 2 refills | Status: DC | PRN

## 2021-04-21 MED ORDER — FLUOXETINE HCL 40 MG PO CAPS: 40.0000 mg | ORAL_CAPSULE | Freq: Every day | ORAL | 2 refills | Status: DC

## 2021-04-21 NOTE — Progress Notes (Signed)
[name] [id number] 18-Jan-1971 50 y.o.  Subjective:   Patient ID:  Walter Abbott is a 50 y.o. (DOB February 26, 1971) male.  Chief Complaint: No chief complaint on file.   HPI JAMIESON HETLAND presents to the office today for follow-up of GAD.  Describes mood today as "ok". Pleasant. Mood symptoms - denies depression and irritability. Decreased anxiety. Feels like the Prozac and Clonazepam are helpful. Stating "I feel like I'm doing alright". Would like to increase Prozac 20mg  to 40mg . Plans to change Prozac to evening - "making me sleepy during the day". Less issues with body odor in the office setting. Family doing well. Statble interest and motinvation. Taking medications as prescribed. Energy levels stable. Active, does not have a regular exercise routine. Walking some days Enjoys some usual interests and activities. Married. Lives with wife and 2 children - 9 and 32. Spending time with family. Appetite adequate. Weight stable - 160 pounds.  Sleeps well most nights. Averages 8 hours. Focus and concentration stable. Completing tasks. Managing aspects of household. Works full-time - 45 to 50 hours. Denies SI or HI.  Denies AH or VH. Diagnosed with ADHD in 2016 - trialed on Adderall.  Previous medication trials: Adderall, Propranolol - GI upset,      Review of Systems: . Review of Systems  Musculoskeletal:  Negative for gait problem.  Neurological:  Negative for tremors.  Psychiatric/Behavioral:         Please refer to HPI   Medications: I have reviewed the patient's current medications.  Current Outpatient Medications  Medication Sig Dispense Refill   Ascorbic Acid (VITAMIN C PO) Take 1 tablet by mouth daily.       ciclopirox (PENLAC) 8 % solution Apply topically at bedtime. Apply over nail and surrounding skin. Apply daily over previous coat. After seven (7) days, may remove with alcohol and continue cycle. 6.6 mL 4   clonazePAM (KLONOPIN) 0.5 MG tablet Take 1 tablet  (0.5 mg total) by mouth 3 (three) times daily as needed for anxiety. 90 tablet 2   FLUoxetine (PROZAC) 40 MG capsule Take 1 capsule (40 mg total) by mouth daily. 30 capsule 2   Multiple Vitamin (MULITIVITAMIN WITH MINERALS) TABS Take 1 tablet by mouth daily.       terbinafine (LAMISIL) 250 MG tablet Take 1 tablet (250 mg total) by mouth daily. 90 tablet 0   No current facility-administered medications for this visit.    Medication Side Effects: None  Allergies:  Allergies  Allergen Reactions   Sulfa Antibiotics Other (See Comments)    Family history of sulfa allergy    No past medical history on file.  Past Medical History, Surgical history, Social history, and Family history were reviewed and updated as appropriate.   Please see review of systems for further details on the patient's review from today.   Objective:   Physical Exam:  There were no vitals taken for this visit.  Physical Exam Constitutional:      General: He is not in acute distress. Musculoskeletal:        General: No deformity.  Neurological:     Mental Status: He is alert and oriented to person, place, and time.     Coordination: Coordination normal.  Psychiatric:        Attention and Perception: Attention and perception normal. He does not perceive auditory or visual hallucinations.        Mood and Affect: Mood normal. Mood is not anxious or depressed. Affect is not  labile, blunt, angry or inappropriate.        Speech: Speech normal.        Behavior: Behavior normal.        Thought Content: Thought content normal. Thought content is not paranoid or delusional. Thought content does not include homicidal or suicidal ideation. Thought content does not include homicidal or suicidal plan.        Cognition and Memory: Cognition and memory normal.        Judgment: Judgment normal.     Comments: Insight intact    Lab Review:     Component Value Date/Time   NA 142 07/16/2011 1214   K 4.0 07/16/2011 1214    CL 102 07/16/2011 1214   GLUCOSE 90 07/16/2011 1214   BUN 17 07/16/2011 1214   CREATININE 0.90 07/16/2011 1214       Component Value Date/Time   HGB 15.6 07/16/2011 1214   HCT 46.0 07/16/2011 1214    No results found for: POCLITH, LITHIUM   No results found for: PHENYTOIN, PHENOBARB, VALPROATE, CBMZ   .res Assessment: Plan:    Plan:  PDMP reviewed  Clonazepam 0.5mg  daily to tid as needed - not taking as prescribed most days. Prozac 40mg  daily  RTC 6/8 weeks  Patient advised to contact office with any questions, adverse effects, or acute worsening in signs and symptoms.  Discussed potential benefits, risk, and side effects of benzodiazepines to include potential risk of tolerance and dependence, as well as possible drowsiness.  Advised patient not to drive if experiencing drowsiness and to take lowest possible effective dose to minimize risk of dependence and tolerance.   Diagnoses and all orders for this visit:  Paranoid schizophrenia (HCC)  No diagnosis on Axis I     Please see After Visit Summary for patient specific instructions.  Future Appointments  Date Time Provider Department Center  07/22/2021  8:15 AM McDonald, 09/19/2021, DPM [facility]    No orders of the defined types were placed in this encounter.   -------------------------------

## 2021-04-21 NOTE — Progress Notes (Signed)
Patient no show appointment. ? ?

## 2021-04-21 NOTE — Addendum Note (Signed)
Addended by: Dorothyann Gibbs on: 04/21/2021 10:08 AM   Modules accepted: Orders, Level of Service

## 2021-05-20 ENCOUNTER — Other Ambulatory Visit: Payer: Self-pay | Admitting: Adult Health

## 2021-05-20 DIAGNOSIS — F411 Generalized anxiety disorder: Secondary | ICD-10-CM

## 2021-06-20 ENCOUNTER — Telehealth (INDEPENDENT_AMBULATORY_CARE_PROVIDER_SITE_OTHER): Payer: BC Managed Care – PPO | Admitting: Adult Health

## 2021-06-20 ENCOUNTER — Encounter: Payer: Self-pay | Admitting: Adult Health

## 2021-06-20 DIAGNOSIS — F411 Generalized anxiety disorder: Secondary | ICD-10-CM

## 2021-06-20 MED ORDER — CLONAZEPAM 0.5 MG PO TABS: 0.5000 mg | ORAL_TABLET | Freq: Three times a day (TID) | ORAL | 2 refills | Status: DC | PRN

## 2021-06-20 MED ORDER — FLUOXETINE HCL 40 MG PO CAPS: 40.0000 mg | ORAL_CAPSULE | Freq: Every day | ORAL | 1 refills | Status: DC

## 2021-06-20 NOTE — Progress Notes (Signed)
Walter Abbott [id number] 06/08/1971 50 y.o.  Virtual Visit via Video Note  I connected with pt @ on 06/20/21 at  9:40 AM EST by a video enabled telemedicine application and verified that I am speaking with the correct person using two identifiers.   I discussed the limitations of evaluation and management by telemedicine and the availability of in person appointments. The patient expressed understanding and agreed to proceed.  I discussed the assessment and treatment plan with the patient. The patient was provided an opportunity to ask questions and all were answered. The patient agreed with the plan and demonstrated an understanding of the instructions.   The patient was advised to call back or seek an in-person evaluation if the symptoms worsen or if the condition fails to improve as anticipated.  I provided 10 minutes of non-face-to-face time during this encounter.  The patient was located at home.  The provider was located at [place].   Aloha Gell, NP   Subjective:   Patient ID:  Walter Abbott is a 50 y.o. (DOB 1971/01/08) male.  Chief Complaint: No chief complaint on file.   HPI TALOR CONWILL presents for follow-up of GAD.  Describes mood today as "ok". Pleasant. Mood symptoms - denies depression and irritability. Decreased anxiety. Feels like the Prozac and Clonazepam are working well. Denies drowsiness with Prozac. Using Clonazepam mostly when in the office setting. Stating "I'm doing well". Decreased issues with body odor in the office setting - mostly resolved. Working from home for the most part. Family doing well. Statble interest and motinvation. Taking medications as prescribed. Energy levels stable. Active, does not have a regular exercise routine. Walking some days. Getting a rowing machine. Enjoys some usual interests and activities. Married. Lives with wife and 2 children. Spending time with family. Appetite adequate. Weight stable - 160  pounds.  Sleeps well most nights. Averages 8 hours. Focus and concentration stable. Completing tasks. Managing aspects of household. Works full-time - 45 to 50 hours. Denies SI or HI.  Denies AH or VH. Diagnosed with ADHD in 2016 - trialed on Adderall.  Previous medication trials: Adderall, Propranolol - GI upset,       Review of Systems:  Review of Systems  Musculoskeletal:  Negative for gait problem.  Neurological:  Negative for tremors.  Psychiatric/Behavioral:         Please refer to HPI   Medications: I have reviewed the patient's current medications.  Current Outpatient Medications  Medication Sig Dispense Refill   Ascorbic Acid (VITAMIN C PO) Take 1 tablet by mouth daily.       ciclopirox (PENLAC) 8 % solution Apply topically at bedtime. Apply over nail and surrounding skin. Apply daily over previous coat. After seven (7) days, may remove with alcohol and continue cycle. 6.6 mL 4   clonazePAM (KLONOPIN) 0.5 MG tablet Take 1 tablet (0.5 mg total) by mouth 3 (three) times daily as needed for anxiety. 90 tablet 2   FLUoxetine (PROZAC) 40 MG capsule Take 1 capsule (40 mg total) by mouth daily. 90 capsule 1   Multiple Vitamin (MULITIVITAMIN WITH MINERALS) TABS Take 1 tablet by mouth daily.       No current facility-administered medications for this visit.    Medication Side Effects: None  Allergies:  Allergies  Allergen Reactions   Sulfa Antibiotics Other (See Comments)    Family history of sulfa allergy    No past medical history on file.  No family history on file.  Social History   Socioeconomic History   Marital status: Married    Spouse name: Not on file   Number of children: Not on file   Years of education: Not on file   Highest education level: Not on file  Occupational History   Not on file  Tobacco Use   Smoking status: Never   Smokeless tobacco: Never  Substance and Sexual Activity   Alcohol use: Yes   Drug use: Not on file   Sexual activity:  Not on file  Other Topics Concern   Not on file  Social History Narrative   Not on file   Social Determinants of Health   Financial Resource Strain: Not on file  Food Insecurity: Not on file  Transportation Needs: Not on file  Physical Activity: Not on file  Stress: Not on file  Social Connections: Not on file  Intimate Partner Violence: Not on file    Past Medical History, Surgical history, Social history, and Family history were reviewed and updated as appropriate.   Please see review of systems for further details on the patient's review from today.   Objective:   Physical Exam:  There were no vitals taken for this visit.  Physical Exam Constitutional:      General: He is not in acute distress. Musculoskeletal:        General: No deformity.  Neurological:     Mental Status: He is alert and oriented to person, place, and time.     Coordination: Coordination normal.  Psychiatric:        Attention and Perception: Attention and perception normal. He does not perceive auditory or visual hallucinations.        Mood and Affect: Mood normal. Mood is not anxious or depressed. Affect is not labile, blunt, angry or inappropriate.        Speech: Speech normal.        Behavior: Behavior normal.        Thought Content: Thought content normal. Thought content is not paranoid or delusional. Thought content does not include homicidal or suicidal ideation. Thought content does not include homicidal or suicidal plan.        Cognition and Memory: Cognition and memory normal.        Judgment: Judgment normal.     Comments: Insight intact    Lab Review:     Component Value Date/Time   NA 142 07/16/2011 1214   K 4.0 07/16/2011 1214   CL 102 07/16/2011 1214   GLUCOSE 90 07/16/2011 1214   BUN 17 07/16/2011 1214   CREATININE 0.90 07/16/2011 1214       Component Value Date/Time   HGB 15.6 07/16/2011 1214   HCT 46.0 07/16/2011 1214    No results found for: POCLITH, LITHIUM   No  results found for: PHENYTOIN, PHENOBARB, VALPROATE, CBMZ   .res Assessment: Plan:    Plan:  PDMP reviewed  Clonazepam 0.5mg  daily to tid as needed - not taking as prescribed most days. Prozac 40mg  daily  RTC 6 months  Patient advised to contact office with any questions, adverse effects, or acute worsening in signs and symptoms.  Discussed potential benefits, risk, and side effects of benzodiazepines to include potential risk of tolerance and dependence, as well as possible drowsiness.  Advised patient not to drive if experiencing drowsiness and to take lowest possible effective dose to minimize risk of dependence and tolerance. Diagnoses and all orders for this visit:  Generalized anxiety disorder -  FLUoxetine (PROZAC) 40 MG capsule; Take 1 capsule (40 mg total) by mouth daily. -     clonazePAM (KLONOPIN) 0.5 MG tablet; Take 1 tablet (0.5 mg total) by mouth 3 (three) times daily as needed for anxiety.    Please see After Visit Summary for patient specific instructions.  Future Appointments  Date Time Provider Department Center  07/22/2021  8:15 AM McDonald, Rachelle Hora, DPM [facility]    No orders of the defined types were placed in this encounter.     -------------------------------

## 2021-07-22 ENCOUNTER — Ambulatory Visit (INDEPENDENT_AMBULATORY_CARE_PROVIDER_SITE_OTHER): Payer: BC Managed Care – PPO | Admitting: Podiatry

## 2021-07-22 ENCOUNTER — Encounter: Payer: Self-pay | Admitting: Podiatry

## 2021-07-22 ENCOUNTER — Other Ambulatory Visit: Payer: Self-pay

## 2021-07-22 DIAGNOSIS — B351 Tinea unguium: Secondary | ICD-10-CM

## 2021-07-22 MED ORDER — FLUCONAZOLE 150 MG PO TABS: 150.0000 mg | ORAL_TABLET | ORAL | 0 refills | Status: DC

## 2021-07-22 NOTE — Progress Notes (Signed)
°  Subjective:  Patient ID: Walter Abbott, male    DOB: 1970-12-30,  MRN: [id number]  Chief Complaint  Patient presents with   Nail Problem    Thick painful toenails    51 y.o. male presents with the above complaint. History confirmed with patient.  Continues to improve.  Not quite on her percent yet seems to be growing slower than his other nails  Objective:  Physical Exam: warm, good capillary refill, no trophic changes or ulcerative lesions, normal DP and PT pulses and normal sensory exam.  Dystrophic mycotic nails with yellowed cracking nail plates bilateral hallux now about 75 % proximal clearing     Assessment:   1. Onychomycosis      Plan:  Patient was evaluated and treated and all questions answered.  Suffer doing well and is responded well to antifungal treatment.  I think it would be worth switching to a different agent at this point to see if he has any better success with it.  I switched him to fluconazole pulsed dosing weekly 150 mg.  We also discussed the option of laser treatment if not improving.  Rx was sent to pharmacy and he will see me back in 6 months  Return in about 6 months (around 01/19/2022) for follow up after nail fungus treatment.

## 2021-08-21 ENCOUNTER — Other Ambulatory Visit: Payer: Self-pay | Admitting: Podiatry

## 2021-08-25 MED ORDER — FLUCONAZOLE 150 MG PO TABS: 150.0000 mg | ORAL_TABLET | ORAL | 0 refills | Status: DC

## 2021-09-08 ENCOUNTER — Other Ambulatory Visit: Payer: Self-pay | Admitting: Podiatry

## 2021-09-08 NOTE — Telephone Encounter (Signed)
Please advise 

## 2021-09-09 MED ORDER — FLUCONAZOLE 150 MG PO TABS: 150.0000 mg | ORAL_TABLET | ORAL | 0 refills | Status: DC

## 2021-10-02 ENCOUNTER — Other Ambulatory Visit: Payer: Self-pay | Admitting: Podiatry

## 2021-10-03 NOTE — Telephone Encounter (Signed)
Please advise 

## 2021-10-07 MED ORDER — FLUCONAZOLE 150 MG PO TABS: 150.0000 mg | ORAL_TABLET | ORAL | 0 refills | Status: DC

## 2021-11-01 ENCOUNTER — Other Ambulatory Visit: Payer: Self-pay | Admitting: Podiatry

## 2021-11-03 MED ORDER — FLUCONAZOLE 150 MG PO TABS: 150.0000 mg | ORAL_TABLET | ORAL | 0 refills | Status: AC

## 2021-11-03 NOTE — Telephone Encounter (Signed)
Please advise 

## 2021-11-13 ENCOUNTER — Ambulatory Visit (INDEPENDENT_AMBULATORY_CARE_PROVIDER_SITE_OTHER): Payer: BC Managed Care – PPO | Admitting: Adult Health

## 2021-11-13 ENCOUNTER — Encounter: Payer: Self-pay | Admitting: Adult Health

## 2021-11-13 DIAGNOSIS — F411 Generalized anxiety disorder: Secondary | ICD-10-CM | POA: Diagnosis not present

## 2021-11-13 MED ORDER — FLUOXETINE HCL 10 MG PO CAPS: ORAL_CAPSULE | ORAL | 5 refills | Status: DC

## 2021-11-13 MED ORDER — CLONAZEPAM 0.5 MG PO TABS: 0.5000 mg | ORAL_TABLET | Freq: Three times a day (TID) | ORAL | 2 refills | Status: DC | PRN

## 2021-11-13 NOTE — Progress Notes (Signed)
SAMMY CASSAR ?[id number] ?03-05-1946 ?51 y.o. ? ?Subjective:  ? ?Patient ID:  Walter Abbott is a 51 y.o. (DOB [birth year]) male. ? ?Chief Complaint: No chief complaint on file. ? ? ?HPI ?Walter Abbott presents to the office today for follow-up of GAD. ? ?Describes mood today as "ok". Pleasant. Mood symptoms - denies depression and irritability. Decreased anxiety - just sometimes. Mood is consistent. Stating "I'm doing good".Feels like the Prozac and Clonazepam work well. Using Clonazepam mostly in the office setting. Decreased issues with body odor in the office setting. Working from home for the most part. Family doing well. Statble interest and motinvation. Taking medications as prescribed. ?Energy levels stable. Active, does not have a regular exercise routine. Walking some days. Rowing machine. ?Enjoys some usual interests and activities. Married. Lives with wife and 2 children. Spending time with family. ?Appetite adequate. Weight stable - 160 pounds.  ?Sleeps well most nights. Averages 8 hours. ?Focus and concentration stable. Completing tasks. Managing aspects of household. Works full-time - 45 to 50 hours. ?Denies SI or HI.  ?Denies AH or VH. ?Diagnosed with ADHD in 2016 - trialed on Adderall. ? ?Previous medication trials: Adderall, Propranolol - GI upset,  ? ?Review of Systems:  ?Review of Systems  ?Musculoskeletal:  Negative for gait problem.  ?Neurological:  Negative for tremors.  ?Psychiatric/Behavioral:    ?     Please refer to HPI  ? ?Medications: I have reviewed the patient's current medications. ? ?Current Outpatient Medications  ?Medication Sig Dispense Refill  ? Ascorbic Acid (VITAMIN C PO) Take 1 tablet by mouth daily.      ? ciclopirox (PENLAC) 8 % solution Apply topically at bedtime. Apply over nail and surrounding skin. Apply daily over previous coat. After seven (7) days, may remove with alcohol and continue cycle. 6.6 mL 4  ? clonazePAM (KLONOPIN) 0.5 MG tablet Take 1 tablet (0.5 mg  total) by mouth 3 (three) times daily as needed for anxiety. 90 tablet 2  ? fluconazole (DIFLUCAN) 150 MG tablet Take 1 tablet (150 mg total) by mouth once a week for 26 doses. 26 tablet 0  ? Multiple Vitamin (MULITIVITAMIN WITH MINERALS) TABS Take 1 tablet by mouth daily.      ? ?No current facility-administered medications for this visit.  ? ? ?Medication Side Effects: None ? ?Allergies:  ?Allergies  ?Allergen Reactions  ? Sulfa Antibiotics Other (See Comments)  ?  Family history of sulfa allergy  ? ? ?No past medical history on file. ? ?Past Medical History, Surgical history, Social history, and Family history were reviewed and updated as appropriate.  ? ?Please see review of systems for further details on the patient's review from today.  ? ?Objective:  ? ?Physical Exam:  ?There were no vitals taken for this visit. ? ?Physical Exam ?Constitutional:   ?   General: He is not in acute distress. ?Musculoskeletal:     ?   General: No deformity.  ?Neurological:  ?   Mental Status: He is alert and oriented to person, place, and time.  ?   Coordination: Coordination normal.  ?Psychiatric:     ?   Attention and Perception: Attention and perception normal. He does not perceive auditory or visual hallucinations.     ?   Mood and Affect: Mood normal. Mood is not anxious or depressed. Affect is not labile, blunt, angry or inappropriate.     ?   Speech: Speech normal.     ?   Behavior: Behavior  normal.     ?   Thought Content: Thought content normal. Thought content is not paranoid or delusional. Thought content does not include homicidal or suicidal ideation. Thought content does not include homicidal or suicidal plan.     ?   Cognition and Memory: Cognition and memory normal.     ?   Judgment: Judgment normal.  ?   Comments: Insight intact  ? ? ?Lab Review:  ?   ?Component Value Date/Time  ? NA 142 07/16/2011 1214  ? K 4.0 07/16/2011 1214  ? CL 102 07/16/2011 1214  ? GLUCOSE 90 07/16/2011 1214  ? BUN 17 07/16/2011 1214  ?  CREATININE 0.90 07/16/2011 1214  ? ? ?   ?Component Value Date/Time  ? HGB 15.6 07/16/2011 1214  ? HCT 46.0 07/16/2011 1214  ? ? ?No results found for: POCLITH, LITHIUM  ? ?No results found for: PHENYTOIN, PHENOBARB, VALPROATE, CBMZ  ? ?.res ?Assessment: Plan:   ? ?Plan: ? ?PDMP reviewed ? ?Clonazepam 0.5mg  tid as needed - not taking as prescribed most days. ?Prozac 40mg  daily - taking it at bedtime. ? ?RTC 6 months ? ?Time spent with patient was 15 minutes. Greater than 50% of face to face time with patient was spent on counseling and coordination of care.   ? ?Patient advised to contact office with any questions, adverse effects, or acute worsening in signs and symptoms. ? ?Discussed potential benefits, risk, and side effects of benzodiazepines to include potential risk of tolerance and dependence, as well as possible drowsiness. Advised patient not to drive if experiencing drowsiness and to take lowest possible effective dose to minimize risk of dependence and tolerance. ? ?Diagnoses and all orders for this visit: ? ?Generalized anxiety disorder ? ?  ? ?Please see After Visit Summary for patient specific instructions. ? ?Future Appointments  ?Date Time Provider Department Center  ?01/27/2022  8:15 AM McDonald, 03/30/2022, DPM [facility]  ? ? ?No orders of the defined types were placed in this encounter. ? ? ?------------------------------- ?

## 2021-12-07 ENCOUNTER — Other Ambulatory Visit: Payer: Self-pay | Admitting: Adult Health

## 2021-12-07 DIAGNOSIS — F411 Generalized anxiety disorder: Secondary | ICD-10-CM

## 2021-12-09 ENCOUNTER — Other Ambulatory Visit: Payer: Self-pay | Admitting: Podiatry

## 2021-12-09 NOTE — Telephone Encounter (Signed)
Please advise 

## 2022-01-27 ENCOUNTER — Ambulatory Visit: Payer: BC Managed Care – PPO | Admitting: Podiatry

## 2022-03-03 ENCOUNTER — Encounter: Payer: Self-pay | Admitting: Podiatry

## 2022-03-03 ENCOUNTER — Ambulatory Visit (INDEPENDENT_AMBULATORY_CARE_PROVIDER_SITE_OTHER): Payer: BC Managed Care – PPO | Admitting: Podiatry

## 2022-03-03 DIAGNOSIS — B351 Tinea unguium: Secondary | ICD-10-CM | POA: Diagnosis not present

## 2022-03-03 NOTE — Progress Notes (Signed)
  Subjective:  Patient ID: Walter Abbott, male    DOB: Jan 06, 1971,  MRN: [id number]  Chief Complaint  Patient presents with   Nail Problem    Nail fungus re-check- patient is taking Fluconazole pulse dose weekly.  [facility]    51 y.o. male presents with the above complaint. History confirmed with patient.  Doing well is nearly fully healed  Objective:  Physical Exam: warm, good capillary refill, no trophic changes or ulcerative lesions, normal DP and PT pulses and normal sensory exam.  Near full healing of the bilateral hallux nail, mild yellow discoloration on margins        Assessment:   1. Onychomycosis      Plan:  Patient was evaluated and treated and all questions answered.  Overall doing much better.  He will complete his current pulsed dosing fluconazole.  Continue physical  Until this completely resolves.  We also discussed the option of laser treatment if it does not fully resolve or worsens.  I will see him back on a as needed basis for this. Return if symptoms worsen or fail to improve.

## 2022-04-21 ENCOUNTER — Other Ambulatory Visit: Payer: Self-pay | Admitting: Adult Health

## 2022-04-21 DIAGNOSIS — F411 Generalized anxiety disorder: Secondary | ICD-10-CM

## 2022-05-14 ENCOUNTER — Ambulatory Visit (INDEPENDENT_AMBULATORY_CARE_PROVIDER_SITE_OTHER): Payer: BC Managed Care – PPO | Admitting: Adult Health

## 2022-05-14 ENCOUNTER — Encounter: Payer: Self-pay | Admitting: Adult Health

## 2022-05-14 DIAGNOSIS — F411 Generalized anxiety disorder: Secondary | ICD-10-CM

## 2022-05-14 MED ORDER — CLONAZEPAM 0.5 MG PO TABS: 0.5000 mg | ORAL_TABLET | Freq: Three times a day (TID) | ORAL | 2 refills | Status: DC | PRN

## 2022-05-14 MED ORDER — FLUOXETINE HCL 40 MG PO CAPS: 40.0000 mg | ORAL_CAPSULE | Freq: Every day | ORAL | 1 refills | Status: DC

## 2022-05-14 NOTE — Progress Notes (Signed)
Walter Abbott [id number] Nov 27, 1970 51 y.o.  Subjective:   Patient ID:  Walter Abbott is a 51 y.o. (DOB 1971-03-01) male.  Chief Complaint: No chief complaint on file.   HPI Walter Abbott presents to the office today for follow-up of GAD.  Describes mood today as "ok". Pleasant. Mood symptoms - denies depression and irritability. Decreased anxiety - using Clonazepam as needed. Mood is consistent. Stating "I'm doing good". Feels like the Prozac and Clonazepam work well. Family doing well. Statble interest and motinvation. Taking medications as prescribed. Energy levels stable. Active, does not have a regular exercise routine.   Enjoys some usual interests and activities. Married. Lives with wife and 2 children. Spending time with family. Appetite adequate. Weight gain - 160 to 175 pounds.  Sleeps well most nights. Averages 7 to 8 hours. Focus and concentration stable. Completing tasks. Managing aspects of household. Works full-time - 45 to 50 hours. Denies SI or HI.  Denies AH or VH.  Diagnosed with ADHD in 2016 - trialed on Adderall.  Previous medication trials: Adderall, Propranolol - GI upset,        Review of Systems:  Review of Systems  Musculoskeletal:  Negative for gait problem.  Neurological:  Negative for tremors.  Psychiatric/Behavioral:         Please refer to HPI    Medications: I have reviewed the patient's current medications.  Current Outpatient Medications  Medication Sig Dispense Refill   Ascorbic Acid (VITAMIN C PO) Take 1 tablet by mouth daily.       ciclopirox (PENLAC) 8 % solution APPLY OVER NAIL & SURROUNDING SKIN AT BED, OVER PREVIOUS COAT. REMOVE IN 7 DAYS W/ ALCOHOL &REPEAT 6.6 mL 4   clonazePAM (KLONOPIN) 0.5 MG tablet Take 1 tablet (0.5 mg total) by mouth 3 (three) times daily as needed for anxiety. 90 tablet 2   FLUoxetine (PROZAC) 10 MG capsule TAKE 3 CAPSULES BY MOUTH EVERY DAY 270 capsule 1   Multiple Vitamin (MULITIVITAMIN WITH  MINERALS) TABS Take 1 tablet by mouth daily.       No current facility-administered medications for this visit.    Medication Side Effects: None  Allergies:  Allergies  Allergen Reactions   Sulfa Antibiotics Other (See Comments)    Family history of sulfa allergy    No past medical history on file.  Past Medical History, Surgical history, Social history, and Family history were reviewed and updated as appropriate.   Please see review of systems for further details on the patient's review from today.   Objective:   Physical Exam:  There were no vitals taken for this visit.  Physical Exam Constitutional:      General: He is not in acute distress. Musculoskeletal:        General: No deformity.  Neurological:     Mental Status: He is alert and oriented to person, place, and time.     Coordination: Coordination normal.  Psychiatric:        Attention and Perception: Attention and perception normal. He does not perceive auditory or visual hallucinations.        Mood and Affect: Mood normal. Mood is not anxious or depressed. Affect is not labile, blunt, angry or inappropriate.        Speech: Speech normal.        Behavior: Behavior normal.        Thought Content: Thought content normal. Thought content is not paranoid or delusional. Thought content does not include homicidal or  suicidal ideation. Thought content does not include homicidal or suicidal plan.        Cognition and Memory: Cognition and memory normal.        Judgment: Judgment normal.     Comments: Insight intact     Lab Review:     Component Value Date/Time   NA 142 07/16/2011 1214   K 4.0 07/16/2011 1214   CL 102 07/16/2011 1214   GLUCOSE 90 07/16/2011 1214   BUN 17 07/16/2011 1214   CREATININE 0.90 07/16/2011 1214       Component Value Date/Time   HGB 15.6 07/16/2011 1214   HCT 46.0 07/16/2011 1214    No results found for: "POCLITH", "LITHIUM"   No results found for: "PHENYTOIN", "PHENOBARB",  "VALPROATE", "CBMZ"   .res Assessment: Plan:    Plan:  PDMP reviewed  Clonazepam 0.5mg  tid as needed - not taking as prescribed most days. Prozac 40mg  daily - taking it at bedtime.  RTC 6 months  Time spent with patient was 15 minutes. Greater than 50% of face to face time with patient was spent on counseling and coordination of care.    Patient advised to contact office with any questions, adverse effects, or acute worsening in signs and symptoms.  Discussed potential benefits, risk, and side effects of benzodiazepines to include potential risk of tolerance and dependence, as well as possible drowsiness. Advised patient not to drive if experiencing drowsiness and to take lowest possible effective dose to minimize risk of dependence and tolerance. There are no diagnoses linked to this encounter.   Please see After Visit Summary for patient specific instructions.  No future appointments.  No orders of the defined types were placed in this encounter.   -------------------------------

## 2022-11-13 ENCOUNTER — Ambulatory Visit (INDEPENDENT_AMBULATORY_CARE_PROVIDER_SITE_OTHER): Payer: BC Managed Care – PPO | Admitting: Adult Health

## 2022-11-13 ENCOUNTER — Encounter: Payer: Self-pay | Admitting: Adult Health

## 2022-11-13 DIAGNOSIS — F411 Generalized anxiety disorder: Secondary | ICD-10-CM | POA: Diagnosis not present

## 2022-11-13 MED ORDER — FLUOXETINE HCL 40 MG PO CAPS: 40.0000 mg | ORAL_CAPSULE | Freq: Every day | ORAL | 3 refills | Status: DC

## 2022-11-13 MED ORDER — ALPRAZOLAM 0.5 MG PO TABS: 0.5000 mg | ORAL_TABLET | Freq: Three times a day (TID) | ORAL | 2 refills | Status: DC | PRN

## 2022-11-13 NOTE — Progress Notes (Signed)
Walter Abbott [id number] October 20, 1970 52 y.o.  Subjective:   Patient ID:  Walter Abbott is a 52 y.o. (DOB 07-17-1971) male.  Chief Complaint: No chief complaint on file.   HPI Walter Abbott presents to the office today for follow-up of GAD.  Describes mood today as "ok". Pleasant. Mood symptoms - denies depression and irritability. Reports periods of anxiety - "work related". Denies worry, rumination, and overt hinking. Mood is consistent. Stating "I'm doing pretty good". Feels like the Prozac works well. Would like to switch from Clonazepam to Xanax as needed - less sedation. Family doing well. Statble interest and motinvation. Taking medications as prescribed. Energy levels stable. Active, does not have a regular exercise routine.   Enjoys some usual interests and activities. Married. Lives with wife and 2 children. Spending time with family. Appetite adequate. Weight stable - 175 pounds.  Sleeps well most nights. Averages 7 to 8 hours. Focus and concentration stable. Completing tasks. Managing aspects of household. 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  Denies AH or VH. Denies self harm. Denies substance.  Diagnosed with ADHD in 2016 - trialed on Adderall.  Previous medication trials: Adderall, Propranolol - GI upset,    Review of Systems:  Review of Systems  Musculoskeletal:  Negative for gait problem.  Neurological:  Negative for tremors.  Psychiatric/Behavioral:         Please refer to HPI    Medications: I have reviewed the patient's current medications.  Current Outpatient Medications  Medication Sig Dispense Refill   ALPRAZolam (XANAX) 0.5 MG tablet Take 1 tablet (0.5 mg total) by mouth 3 (three) times daily as needed for anxiety. 90 tablet 2   Ascorbic Acid (VITAMIN C PO) Take 1 tablet by mouth daily.       clonazePAM (KLONOPIN) 0.5 MG tablet Take 1 tablet (0.5 mg total) by mouth 3 (three) times daily as needed for anxiety. 90 tablet 2   FLUoxetine  (PROZAC) 40 MG capsule Take 1 capsule (40 mg total) by mouth daily. 90 capsule 3   Multiple Vitamin (MULITIVITAMIN WITH MINERALS) TABS Take 1 tablet by mouth daily.       No current facility-administered medications for this visit.    Medication Side Effects: None  Allergies:  Allergies  Allergen Reactions   Sulfa Antibiotics Other (See Comments)    Family history of sulfa allergy    No past medical history on file.  Past Medical History, Surgical history, Social history, and Family history were reviewed and updated as appropriate.   Please see review of systems for further details on the patient's review from today.   Objective:   Physical Exam:  There were no vitals taken for this visit.  Physical Exam Constitutional:      General: He is not in acute distress. Musculoskeletal:        General: No deformity.  Neurological:     Mental Status: He is alert and oriented to person, place, and time.     Coordination: Coordination normal.  Psychiatric:        Attention and Perception: Attention and perception normal. He does not perceive auditory or visual hallucinations.        Mood and Affect: Mood normal. Mood is not anxious or depressed. Affect is not labile, blunt, angry or inappropriate.        Speech: Speech normal.        Behavior: Behavior normal.        Thought Content: Thought content normal.  Thought content is not paranoid or delusional. Thought content does not include homicidal or suicidal ideation. Thought content does not include homicidal or suicidal plan.        Cognition and Memory: Cognition and memory normal.        Judgment: Judgment normal.     Comments: Insight intact     Lab Review:     Component Value Date/Time   NA 142 07/16/2011 1214   K 4.0 07/16/2011 1214   CL 102 07/16/2011 1214   GLUCOSE 90 07/16/2011 1214   BUN 17 07/16/2011 1214   CREATININE 0.90 07/16/2011 1214       Component Value Date/Time   HGB 15.6 07/16/2011 1214   HCT 46.0  07/16/2011 1214    No results found for: "POCLITH", "LITHIUM"   No results found for: "PHENYTOIN", "PHENOBARB", "VALPROATE", "CBMZ"   .res Assessment: Plan:    Plan:  PDMP reviewed  Add Xanax 0.5mg  TID as needed - will call in 3 months for next set of refills.  D/C Clonazepam 0.5mg  tid as needed - taking 1.5 most days Prozac 40mg  daily - taking it at bedtime.  RTC 6 months  Patient advised to contact office with any questions, adverse effects, or acute worsening in signs and symptoms.  Discussed potential benefits, risk, and side effects of benzodiazepines to include potential risk of tolerance and dependence, as well as possible drowsiness. Advised patient not to drive if experiencing drowsiness and to take lowest possible effective dose to minimize risk of dependence and tolerance.  Diagnoses and all orders for this visit:  Generalized anxiety disorder -     FLUoxetine (PROZAC) 40 MG capsule; Take 1 capsule (40 mg total) by mouth daily. -     ALPRAZolam (XANAX) 0.5 MG tablet; Take 1 tablet (0.5 mg total) by mouth 3 (three) times daily as needed for anxiety.    Please see After Visit Summary for patient specific instructions.  No future appointments.  No orders of the defined types were placed in this encounter.   -------------------------------

## 2023-05-14 ENCOUNTER — Ambulatory Visit: Payer: BC Managed Care – PPO | Admitting: Adult Health

## 2023-05-14 ENCOUNTER — Encounter: Payer: Self-pay | Admitting: Adult Health

## 2023-05-14 DIAGNOSIS — F411 Generalized anxiety disorder: Secondary | ICD-10-CM

## 2023-05-14 MED ORDER — ALPRAZOLAM 0.5 MG PO TABS: 0.5000 mg | ORAL_TABLET | Freq: Three times a day (TID) | ORAL | 2 refills | Status: DC | PRN

## 2023-05-14 NOTE — Progress Notes (Signed)
Walter Abbott [id number] 1971-05-09 52 y.o.  Subjective:   Patient ID:  Walter Abbott is a 52 y.o. (DOB 03-26-1971) male.  Chief Complaint: No chief complaint on file.   HPI Walter Abbott presents to the office today for follow-up of GAD.  Describes mood today as "ok". Pleasant. Mood symptoms - denies depression and irritability. Reports periods of anxiety in the work setting, but getting "better". Denies panic attacks. Denies worry, rumination, and over hinking. Mood is consistent. Stating "I'm doing well". Feels like the Prozac 40mg  and Xanax  works well. Family doing well. Statble interest and motinvation. Taking medications as prescribed. Energy levels stable. Active, exercising.  Enjoys some usual interests and activities. Married. Lives with wife and 2 children. Spending time with family. Appetite adequate. Weight stable - 175 pounds.  Sleeps well most nights. Averages 7 to 8 hours. Focus and concentration stable. Completing tasks. Managing aspects of household. 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  Denies AH or VH. Denies self harm. Denies substance.  Diagnosed with ADHD in 2016 - trialed on Adderall.  Previous medication trials: Adderall, Propranolol - GI upset,     Review of Systems:  Review of Systems  Musculoskeletal:  Negative for gait problem.  Neurological:  Negative for tremors.  Psychiatric/Behavioral:         Please refer to HPI    Medications: I have reviewed the patient's current medications.  Current Outpatient Medications  Medication Sig Dispense Refill   ALPRAZolam (XANAX) 0.5 MG tablet Take 1 tablet (0.5 mg total) by mouth 3 (three) times daily as needed for anxiety. 90 tablet 2   Ascorbic Acid (VITAMIN C PO) Take 1 tablet by mouth daily.       clonazePAM (KLONOPIN) 0.5 MG tablet Take 1 tablet (0.5 mg total) by mouth 3 (three) times daily as needed for anxiety. 90 tablet 2   FLUoxetine (PROZAC) 40 MG capsule Take 1 capsule (40 mg total)  by mouth daily. 90 capsule 3   Multiple Vitamin (MULITIVITAMIN WITH MINERALS) TABS Take 1 tablet by mouth daily.       No current facility-administered medications for this visit.    Medication Side Effects: None  Allergies:  Allergies  Allergen Reactions   Sulfa Antibiotics Other (See Comments)    Family history of sulfa allergy    No past medical history on file.  Past Medical History, Surgical history, Social history, and Family history were reviewed and updated as appropriate.   Please see review of systems for further details on the patient's review from today.   Objective:   Physical Exam:  There were no vitals taken for this visit.  Physical Exam Constitutional:      General: He is not in acute distress. Musculoskeletal:        General: No deformity.  Neurological:     Mental Status: He is alert and oriented to person, place, and time.     Coordination: Coordination normal.  Psychiatric:        Attention and Perception: Attention and perception normal. He does not perceive auditory or visual hallucinations.        Mood and Affect: Mood normal. Mood is not anxious or depressed. Affect is not labile, blunt, angry or inappropriate.        Speech: Speech normal.        Behavior: Behavior normal.        Thought Content: Thought content normal. Thought content is not paranoid or delusional. Thought content  does not include homicidal or suicidal ideation. Thought content does not include homicidal or suicidal plan.        Cognition and Memory: Cognition and memory normal.        Judgment: Judgment normal.     Comments: Insight intact     Lab Review:     Component Value Date/Time   NA 142 07/16/2011 1214   K 4.0 07/16/2011 1214   CL 102 07/16/2011 1214   GLUCOSE 90 07/16/2011 1214   BUN 17 07/16/2011 1214   CREATININE 0.90 07/16/2011 1214       Component Value Date/Time   HGB 15.6 07/16/2011 1214   HCT 46.0 07/16/2011 1214    No results found for:  "POCLITH", "LITHIUM"   No results found for: "PHENYTOIN", "PHENOBARB", "VALPROATE", "CBMZ"   .res Assessment: Plan:   Plan:  PDMP reviewed  Xanax 0.5mg  TID as needed  Prozac 40mg  daily - taking it at bedtime.  RTC 6 months  Patient advised to contact office with any questions, adverse effects, or acute worsening in signs and symptoms.  Discussed potential benefits, risk, and side effects of benzodiazepines to include potential risk of tolerance and dependence, as well as possible drowsiness. Advised patient not to drive if experiencing drowsiness and to take lowest possible effective dose to minimize risk of dependence and tolerance. There are no diagnoses linked to this encounter.   Please see After Visit Summary for patient specific instructions.  Future Appointments  Date Time Provider Department Center  05/14/2023  8:00 AM Yoana Staib, Thereasa Solo, NP [facility] None    No orders of the defined types were placed in this encounter.   -------------------------------

## 2023-09-03 ENCOUNTER — Other Ambulatory Visit: Payer: Self-pay | Admitting: Adult Health

## 2023-09-03 DIAGNOSIS — F411 Generalized anxiety disorder: Secondary | ICD-10-CM

## 2023-09-03 NOTE — Telephone Encounter (Signed)
Please send for Walter Abbott. LF 10/17 NV 04/17

## 2023-11-12 ENCOUNTER — Telehealth: Payer: BC Managed Care – PPO | Admitting: Adult Health

## 2023-11-25 ENCOUNTER — Other Ambulatory Visit: Payer: Self-pay | Admitting: Adult Health

## 2023-11-25 DIAGNOSIS — F411 Generalized anxiety disorder: Secondary | ICD-10-CM

## 2024-01-16 ENCOUNTER — Other Ambulatory Visit: Payer: Self-pay | Admitting: Adult Health

## 2024-01-16 DIAGNOSIS — F411 Generalized anxiety disorder: Secondary | ICD-10-CM

## 2024-03-03 ENCOUNTER — Telehealth: Payer: Self-pay | Admitting: Adult Health

## 2024-03-03 DIAGNOSIS — F411 Generalized anxiety disorder: Secondary | ICD-10-CM

## 2024-03-03 MED ORDER — FLUOXETINE HCL 40 MG PO CAPS: 40.0000 mg | ORAL_CAPSULE | Freq: Every day | ORAL | 0 refills | Status: DC

## 2024-03-03 NOTE — Telephone Encounter (Signed)
 Pt called to schedule apt 8/22. Requesting Rx Fluoxetine  40 mg   [facility] [address]

## 2024-03-03 NOTE — Telephone Encounter (Signed)
 Sent!

## 2024-03-10 ENCOUNTER — Encounter: Payer: Self-pay | Admitting: Adult Health

## 2024-03-10 ENCOUNTER — Ambulatory Visit: Admitting: Adult Health

## 2024-03-10 DIAGNOSIS — F411 Generalized anxiety disorder: Secondary | ICD-10-CM | POA: Diagnosis not present

## 2024-03-10 MED ORDER — FLUOXETINE HCL 40 MG PO CAPS: 40.0000 mg | ORAL_CAPSULE | Freq: Every day | ORAL | 5 refills | Status: AC

## 2024-03-10 MED ORDER — ALPRAZOLAM 0.5 MG PO TABS: 0.5000 mg | ORAL_TABLET | Freq: Three times a day (TID) | ORAL | 2 refills | Status: AC | PRN

## 2024-03-10 NOTE — Progress Notes (Signed)
 MILLAN LEGAN [id number] April 10, 1971 53 y.o.  Subjective:   Patient ID:  Walter Abbott is a 53 y.o. (DOB 04-03-1971) male.  Chief Complaint: No chief complaint on file.   HPI Walter Abbott presents to the office today for follow-up of GAD.  Describes mood today as ok. Pleasant. Mood symptoms - denies depression and irritability. Reports stable interest and motinvation.Reports periods of anxiety in the work setting. Denies panic attacks. Denies worry, rumination, and over hinking. Reports mood is stable. Stating I feel like I'm doing ok. Feels like the Prozac  40mg  and Xanax   works well. Taking medications as prescribed. Energy levels stable. Active, does not have a regular exercise routine.  Enjoys some usual interests and activities. Married. Lives with wife and 2 children. Spending time with family. Appetite adequate. Weight gain - 175 to 190 pounds.  Sleeps well most nights. Averages 7 to 8 hours. Focus and concentration stable. Completing tasks. Managing aspects of household. 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  Denies AH or VH. Denies self harm. Denies substance.  Diagnosed with ADHD in 2016 - trialed on Adderall.  Previous medication trials: Adderall, Propranolol  - GI upset,      Review of Systems:  Review of Systems  Musculoskeletal:  Negative for gait problem.  Neurological:  Negative for tremors.  Psychiatric/Behavioral:         Please refer to HPI    Medications: I have reviewed the patient's current medications.  Current Outpatient Medications  Medication Sig Dispense Refill   ALPRAZolam  (XANAX ) 0.5 MG tablet TAKE 1 TABLET (0.5 MG TOTAL) BY MOUTH 3 (THREE) TIMES DAILY AS NEEDED FOR ANXIETY. 90 tablet 1   Ascorbic Acid (VITAMIN C PO) Take 1 tablet by mouth daily.       FLUoxetine  (PROZAC ) 40 MG capsule Take 1 capsule (40 mg total) by mouth daily. 30 capsule 0   Multiple Vitamin (MULITIVITAMIN WITH MINERALS) TABS Take 1 tablet by mouth daily.        No current facility-administered medications for this visit.    Medication Side Effects: None  Allergies:  Allergies  Allergen Reactions   Sulfa Antibiotics Other (See Comments)    Family history of sulfa allergy    No past medical history on file.  Past Medical History, Surgical history, Social history, and Family history were reviewed and updated as appropriate.   Please see review of systems for further details on the patient's review from today.   Objective:   Physical Exam:  There were no vitals taken for this visit.  Physical Exam Constitutional:      General: He is not in acute distress. Musculoskeletal:        General: No deformity.  Neurological:     Mental Status: He is alert and oriented to person, place, and time.     Coordination: Coordination normal.  Psychiatric:        Attention and Perception: Attention and perception normal. He does not perceive auditory or visual hallucinations.        Mood and Affect: Mood normal. Mood is not anxious or depressed. Affect is not labile, blunt, angry or inappropriate.        Speech: Speech normal.        Behavior: Behavior normal.        Thought Content: Thought content normal. Thought content is not paranoid or delusional. Thought content does not include homicidal or suicidal ideation. Thought content does not include homicidal or suicidal plan.  Cognition and Memory: Cognition and memory normal.        Judgment: Judgment normal.     Comments: Insight intact     Lab Review:     Component Value Date/Time   NA 142 07/16/2011 1214   K 4.0 07/16/2011 1214   CL 102 07/16/2011 1214   GLUCOSE 90 07/16/2011 1214   BUN 17 07/16/2011 1214   CREATININE 0.90 07/16/2011 1214       Component Value Date/Time   HGB 15.6 07/16/2011 1214   HCT 46.0 07/16/2011 1214    No results found for: POCLITH, LITHIUM   No results found for: PHENYTOIN, PHENOBARB, VALPROATE, CBMZ   .res Assessment: Plan:     Plan:  PDMP reviewed  Xanax  0.5mg  TID as needed  Prozac  40mg  daily - taking it at bedtime.  RTC 6 months  Patient advised to contact office with any questions, adverse effects, or acute worsening in signs and symptoms.  Discussed potential benefits, risk, and side effects of benzodiazepines to include potential risk of tolerance and dependence, as well as possible drowsiness. Advised patient not to drive if experiencing drowsiness and to take lowest possible effective dose to minimize risk of dependence and tolerance.  There are no diagnoses linked to this encounter.   Please see After Visit Summary for patient specific instructions.  No future appointments.  No orders of the defined types were placed in this encounter.   -------------------------------

## 2024-05-12 ENCOUNTER — Ambulatory Visit (HOSPITAL_COMMUNITY)
Admission: RE | Admit: 2024-05-12 | Discharge: 2024-05-12 | Disposition: A | Discharge status: Home / Self Care | Source: Ambulatory Visit | Attending: Family Medicine | Admitting: Family Medicine

## 2024-05-12 ENCOUNTER — Ambulatory Visit (HOSPITAL_COMMUNITY): Payer: Self-pay

## 2024-05-12 ENCOUNTER — Encounter (HOSPITAL_COMMUNITY): Payer: Self-pay

## 2024-05-12 VITALS — BP 139/90 | HR 83 | Temp 98.2°F | Resp 16

## 2024-05-12 DIAGNOSIS — K625 Hemorrhage of anus and rectum: Secondary | ICD-10-CM | POA: Insufficient documentation

## 2024-05-12 DIAGNOSIS — R194 Change in bowel habit: Secondary | ICD-10-CM | POA: Diagnosis present

## 2024-05-12 DIAGNOSIS — R109 Unspecified abdominal pain: Secondary | ICD-10-CM | POA: Diagnosis present

## 2024-05-12 LAB — COMPREHENSIVE METABOLIC PANEL WITH GFR
ALT: 36 U/L (ref 0–44)
AST: 36 U/L (ref 15–41)
Albumin: 4.2 g/dL (ref 3.5–5.0)
Alkaline Phosphatase: 49 U/L (ref 38–126)
Anion gap: 14 (ref 5–15)
BUN: 7 mg/dL (ref 6–20)
CO2: 26 mmol/L (ref 22–32)
Calcium: 9.5 mg/dL (ref 8.9–10.3)
Chloride: 101 mmol/L (ref 98–111)
Creatinine, Ser: 0.9 mg/dL (ref 0.61–1.24)
GFR, Estimated: >60 mL/min (ref >60)
Glucose, Bld: 58 mg/dL — ABNORMAL LOW (ref 70–99)
Potassium: 4.2 mmol/L (ref 3.5–5.1)
Sodium: 141 mmol/L (ref 135–145)
Total Bilirubin: 0.4 mg/dL (ref 0.0–1.2)
Total Protein: 7.6 g/dL (ref 6.5–8.1)

## 2024-05-12 LAB — CBC
HCT: 47.1 % (ref 39.0–52.0)
Hemoglobin: 15.6 g/dL (ref 13.0–17.0)
MCH: 30.2 pg (ref 26.0–34.0)
MCHC: 33.1 g/dL (ref 30.0–36.0)
MCV: 91.3 fL (ref 80.0–100.0)
Platelets: 322 K/uL (ref 150–400)
RBC: 5.16 MIL/uL (ref 4.22–5.81)
RDW: 12.3 % (ref 11.5–15.5)
WBC: 5.1 K/uL (ref 4.0–10.5)
nRBC: 0 % (ref 0.0–0.2)

## 2024-05-12 LAB — LIPASE, BLOOD: Lipase: 50 U/L (ref 11–51)

## 2024-05-12 NOTE — Discharge Instructions (Addendum)
 We have drawn blood to check blood counts, lipase, and kidney and liver function numbers.  Staff will notify you if there is anything significantly abnormal  You can use the QR code/website at the back of the summary paperwork to schedule yourself a new patient appointment with primary care  If you worsen again in any way, please consider going to emergency room for further evaluation

## 2024-05-12 NOTE — ED Provider Notes (Addendum)
 [facility]    CSN: [id number] Arrival date & time: 05/12/24  9149      History   Chief Complaint Chief Complaint  Patient presents with   Constipation    Constipation overnight 10/17-10/18 followed by rectal bleeding for approx 2 days after starting stool softener. Have taken stool softeners and laxatives throughout this week and the problem has improved. - Entered by patient   Rectal Bleeding    HPI Walter Abbott is a 53 y.o. male.    Constipation Associated symptoms: hematochezia   Rectal Bleeding  Here for rectal bleeding and abdominal pain and loose stools.  In the early morning of October 18 he awoke and had sensation needed to have a bowel movement.  He had a fairly normal bowel movement at that time.  Then in the ensuing hours he had recurrent urge to have a bowel movement and severe abdominal cramping.  He would go to the bathroom and have a little bit of loose stool come out with blood.  The bleeding continued for about 3 days.  He thought that this meant he was constipated so he started taking stool softeners and chewable laxatives.  By October 20 the bleeding had subsided and was resolved.  He has not had any more since then.  His stomach is felt uneasy but he has not had any nausea or vomiting.  He will feel full or bloated after eating.  No fever at any point.  Today is the first time he has had a solid stool.  He continued to have loose stools from when it began until yesterday.  He is allergic to sulfa  He does not have a PCP  He has never had colon cancer screening  He brings up that he had been eating corn dogs that he later found out had been recalled due to woodchips possibly being in them.  He recalls eating 1 about a week to 10 days before the symptoms began. History reviewed. No pertinent past medical history.  Patient Active Problem List   Diagnosis Date Noted   Low back pain 08/14/2020    History reviewed. No pertinent surgical  history.     Home Medications    Prior to Admission medications   Medication Sig Start Date End Date Taking? Authorizing Provider  ALPRAZolam  (XANAX ) 0.5 MG tablet Take 1 tablet (0.5 mg total) by mouth 3 (three) times daily as needed for anxiety. 03/10/24   Mozingo, Regina Nattalie, NP  Ascorbic Acid (VITAMIN C PO) Take 1 tablet by mouth daily.      [provider]  FLUoxetine  (PROZAC ) 40 MG capsule Take 1 capsule (40 mg total) by mouth daily. 03/10/24   Mozingo, Regina Nattalie, NP  Multiple Vitamin (MULITIVITAMIN WITH MINERALS) TABS Take 1 tablet by mouth daily.      [provider]    Family History Family History  Problem Relation Age of Onset   Cancer Mother    Neuropathy Father    Peptic Ulcer Father     Social History Social History   Tobacco Use   Smoking status: Never   Smokeless tobacco: Never  Vaping Use   Vaping status: Never Used  Substance Use Topics   Alcohol use: Yes   Drug use: Never     Allergies   Sulfa antibiotics   Review of Systems Review of Systems  Gastrointestinal:  Positive for constipation and hematochezia.     Physical Exam Triage Vital Signs ED Triage Vitals [05/12/24 0913]  Encounter Vitals Group     BP (!) 139/90     Girls Systolic BP Percentile      Girls Diastolic BP Percentile      Boys Systolic BP Percentile      Boys Diastolic BP Percentile      Pulse Rate 83     Resp 16     Temp 98.2 F (36.8 C)     Temp Source Oral     SpO2 96 %     Weight      Height      Head Circumference      Peak Flow      Pain Score 0     Pain Loc      Pain Education      Exclude from Growth Chart    No data found.  Updated Vital Signs BP (!) 139/90 (BP Location: Left Arm)   Pulse 83   Temp 98.2 F (36.8 C) (Oral)   Resp 16   SpO2 96%   Visual Acuity Right Eye Distance:   Left Eye Distance:   Bilateral Distance:    Right Eye Near:   Left Eye Near:    Bilateral Near:     Physical Exam Vitals reviewed.   Constitutional:      General: He is not in acute distress.    Appearance: He is not ill-appearing, toxic-appearing or diaphoretic.  HENT:     Nose: Nose normal.     Mouth/Throat:     Mouth: Mucous membranes are moist.     Pharynx: No oropharyngeal exudate or posterior oropharyngeal erythema.  Eyes:     Extraocular Movements: Extraocular movements intact.     Conjunctiva/sclera: Conjunctivae normal.     Pupils: Pupils are equal, round, and reactive to light.  Cardiovascular:     Rate and Rhythm: Normal rate and regular rhythm.     Heart sounds: No murmur heard. Pulmonary:     Effort: Pulmonary effort is normal.     Breath sounds: Normal breath sounds.  Abdominal:     General: There is no distension.     Palpations: Abdomen is soft.     Tenderness: There is no guarding.     Comments: There are some mild generalized tenderness  Musculoskeletal:     Cervical back: Neck supple.  Lymphadenopathy:     Cervical: No cervical adenopathy.  Skin:    Capillary Refill: Capillary refill takes less than 2 seconds.     Coloration: Skin is not jaundiced or pale.  Neurological:     General: No focal deficit present.     Mental Status: He is alert and oriented to person, place, and time.  Psychiatric:        Behavior: Behavior normal.      UC Treatments / Results  Labs (all labs ordered are listed, but only abnormal results are displayed) Labs Reviewed  CBC  COMPREHENSIVE METABOLIC PANEL WITH GFR  LIPASE, BLOOD    EKG   Radiology No results found.  Procedures Procedures (including critical care time)  Medications Ordered in UC Medications - No data to display  Initial Impression / Assessment and Plan / UC Course  I have reviewed the triage vital signs and the nursing notes.  Pertinent labs & imaging results that were available during my care of the patient were reviewed by me and considered in my medical decision making (see chart for details).     I discussed with  him that I think this is  likely to have been a gastroenteritis and not constipation starting his symptoms.  I do think it is not completely consistent with the usual gastroenteritis, so I would like him to see gastroenterology, and he has never had colon cancer screening.  He is given COND information for gastro  Also staff to help him set up primary care appointment  CBC, CMP, and lipase are drawn today to assess his symptoms we will notify him if anything is significantly abnormal  Also discussed with him that there is no way for me to tell if woodchips in the corndogs it caused him harm.  I do think that it is unlikely for that to have been a cause of his symptoms, since the symptoms began about a week after he had said corndogs. Final Clinical Impressions(s) / UC Diagnoses   Final diagnoses:  Rectal bleeding  Abdominal pain, unspecified abdominal location  Bowel habit changes     Discharge Instructions      We have drawn blood to check blood counts, lipase, and kidney and liver function numbers.  Staff will notify you if there is anything significantly abnormal  You can use the QR code/website at the back of the summary paperwork to schedule yourself a new patient appointment with primary care  If you worsen again in any way, please consider going to emergency room for further evaluation     ED Prescriptions   None    PDMP not reviewed this encounter.   Vonna Sharlet POUR, MD 05/12/24 1002    Vonna Sharlet POUR, MD 05/12/24 1004    Vonna Sharlet POUR, MD 05/12/24 [phone]

## 2024-05-12 NOTE — ED Triage Notes (Signed)
 Patient reports that he began having rectal pain a week ago.  6 dys ago he had bright red rectal bleeding, rectal pain still and loose stool. Rectal bleeding lasted x 3 days.   Patient states he began taking stool softeners x 5 days and then he also began a chewable laxative x 3 days.  Patient states he has had his first solid stool today. Patient states no visible blood today. Patient also reports that he has been eating soup and after eating he feels full and point to the top of the stomach.  Patient added that he had been eating corn dogs x 5-6 weeks that had a recall because wood chips were found in the corn dogs.

## 2024-05-26 NOTE — Progress Notes (Signed)
 05/29/2024 Walter Abbott [id number] 1970-08-04  Referring provider: No ref. provider found Primary GI doctor: Dr. Charlanne  ASSESSMENT AND PLAN:  Rectal bleeding  after BM associated nocturnal, AB pain, multiple small stools, small volume BRB blood with Bm's Weight loss 7 lbs due to decreased appetitie during that time Urgent care visit 05/08/2024 CBC without anemia, lipase normal, CMET unremarkable Likely infectious versus constipation in Oct, hemorrhoids, rule out malignancy/IBD, Declines rectal Plan for colonoscopy at [facility], We have discussed the risks of bleeding, infection, perforation, medication reactions, and remote risk of death associated with colonoscopy. All questions were answered and the patient acknowledges these risk and wishes to proceed.  Father with colon cancer age 91 Has never had colon cancer Mom with autoimmune condition  Constipation BM every few days but soft - Increase fiber/ water intake, decrease caffeine, increase activity level.   Patient Care Team: Patient, No Pcp Per as PCP - General (General Practice)  HISTORY OF PRESENT ILLNESS: 53 y.o. male with a past medical history listed below presents for evaluation of rectal bleeding.   Discussed the use of AI scribe software for clinical note transcription with the patient, who gave verbal consent to proceed.  History of Present Illness   Walter Abbott is a 53 year old male who presents with rectal bleeding and abdominal pain.  In October, he experienced an episode of rectal bleeding. He woke up on a Friday night with the need to use the bathroom, had a bowel movement, and subsequently experienced significant pain. The pain persisted throughout the night, waking him hourly. He describes the pain as possibly abdominal or rectal, but he was unable to differentiate. By Saturday morning, his wife provided him with stool softeners, and he began drinking more water.  Initially, his bowel movements  were soft, but after taking stool softeners, he experienced diarrhea mixed with bright red blood, which continued through Sunday evening. By Monday, the diarrhea persisted but without bleeding. He had a normal bowel movement by the following Friday, prompting a visit to urgent care due to the unusual nature of the episode. He has not experienced any further episodes of rectal bleeding since then.  No upper gastrointestinal symptoms such as nausea, vomiting, heartburn, or trouble swallowing. He occasionally feels a sensation of food being 'stuck' or heaviness in his stomach after eating. He experienced a weight loss of about six to seven pounds during this period, attributed to a reduced intake of solid food and primarily consuming soup due to stomach discomfort. His weight has not returned to baseline.  Family history is significant for his father having colon cancer at age 70, treated with surgery and radiation, with stable CEA levels since. His mother had an unspecified autoimmune condition and brain cancer.        He  reports that he has never smoked. He has never used smokeless tobacco. He reports current alcohol use. He reports that he does not use drugs.  RELEVANT GI HISTORY, IMAGING AND LABS: Results          CBC    Component Value Date/Time   WBC 5.1 05/12/2024 1500   RBC 5.16 05/12/2024 1500   HGB 15.6 05/12/2024 1500   HCT 47.1 05/12/2024 1500   PLT 322 05/12/2024 1500   MCV 91.3 05/12/2024 1500   MCH 30.2 05/12/2024 1500   MCHC 33.1 05/12/2024 1500   RDW 12.3 05/12/2024 1500   Recent Labs    05/12/24 1500  HGB 15.6  CMP     Component Value Date/Time   NA 141 05/12/2024 1500   K 4.2 05/12/2024 1500   CL 101 05/12/2024 1500   CO2 26 05/12/2024 1500   GLUCOSE 58 (L) 05/12/2024 1500   BUN 7 05/12/2024 1500   CREATININE 0.90 05/12/2024 1500   CALCIUM 9.5 05/12/2024 1500   PROT 7.6 05/12/2024 1500   ALBUMIN 4.2 05/12/2024 1500   AST 36 05/12/2024 1500   ALT 36  05/12/2024 1500   ALKPHOS 49 05/12/2024 1500   BILITOT 0.4 05/12/2024 1500   GFRNONAA >60 05/12/2024 1500      Latest Ref Rng & Units 05/12/2024    3:00 PM  Hepatic Function  Total Protein 6.5 - 8.1 g/dL 7.6   Albumin 3.5 - 5.0 g/dL 4.2   AST 15 - 41 U/L 36   ALT 0 - 44 U/L 36   Alk Phosphatase 38 - 126 U/L 49   Total Bilirubin 0.0 - 1.2 mg/dL 0.4       Current Medications:        Current Outpatient Medications (Other):    ALPRAZolam  (XANAX ) 0.5 MG tablet, Take 1 tablet (0.5 mg total) by mouth 3 (three) times daily as needed for anxiety.   Ascorbic Acid (VITAMIN C PO), Take 1 tablet by mouth daily.     FLUoxetine  (PROZAC ) 40 MG capsule, Take 1 capsule (40 mg total) by mouth daily.   Multiple Vitamin (MULITIVITAMIN WITH MINERALS) TABS, Take 1 tablet by mouth daily.    Medical History:  Past Medical History:  Diagnosis Date   Anxiety    Allergies:  Allergies  Allergen Reactions   Sulfa Antibiotics Other (See Comments)    Family history of sulfa allergy     Surgical History:  He  has no past surgical history on file. Family History:  His family history includes Brain cancer in his mother; Colon cancer in his father; Neuropathy in his father; Peptic Ulcer in his father.  REVIEW OF SYSTEMS  : All other systems reviewed and negative except where noted in the History of Present Illness.  PHYSICAL EXAM: BP 138/76 (BP Location: Left Arm, Patient Position: Sitting, Cuff Size: Normal)   Pulse 80   Ht 6' (1.829 m)   Wt 192 lb 2 oz (87.1 kg)   BMI 26.06 kg/m  Physical Exam   GENERAL APPEARANCE: Well nourished, in no apparent distress. HEENT: No cervical lymphadenopathy, unremarkable thyroid, sclerae anicteric, conjunctiva pink. RESPIRATORY: Respiratory effort normal, breath sounds equal bilaterally without rales, rhonchi, or wheezing. CARDIO: Regular rate and rhythm with no murmurs, rubs, or gallops, peripheral pulses intact. ABDOMEN: Soft, non-distended, active bowel  sounds in all four quadrants, slightly sensitive, uncomfortable, no rebound, no mass appreciated. RECTAL: Declines. MUSCULOSKELETAL: Full range of motion, normal gait, without edema. SKIN: Dry, intact without rashes or lesions. No jaundice. NEURO: Alert, oriented, no focal deficits. PSYCH: Cooperative, normal mood and affect.      Alan JONELLE Coombs, PA-C 9:11 AM

## 2024-05-29 ENCOUNTER — Ambulatory Visit: Admitting: Physician Assistant

## 2024-05-29 ENCOUNTER — Encounter: Payer: Self-pay | Admitting: Physician Assistant

## 2024-05-29 VITALS — BP 138/76 | HR 80 | Ht 72.0 in | Wt 192.1 lb

## 2024-05-29 DIAGNOSIS — K625 Hemorrhage of anus and rectum: Secondary | ICD-10-CM | POA: Diagnosis not present

## 2024-05-29 DIAGNOSIS — Z800 Family history of malignant neoplasm of digestive organs: Secondary | ICD-10-CM | POA: Diagnosis not present

## 2024-05-29 MED ORDER — NA SULFATE-K SULFATE-MG SULF 17.5-3.13-1.6 GM/177ML PO SOLN: 1.0000 | Freq: Once | ORAL | 0 refills | Status: AC

## 2024-05-29 NOTE — Patient Instructions (Addendum)
 You have been scheduled for a colonoscopy. Please follow written instructions given to you at your visit today.   If you use inhalers (even only as needed), please bring them with you on the day of your procedure.  DO NOT TAKE 7 DAYS PRIOR TO TEST- Trulicity (dulaglutide) Ozempic, Wegovy (semaglutide) Mounjaro, Zepbound (tirzepatide) Bydureon Bcise (exanatide extended release)  DO NOT TAKE 1 DAY PRIOR TO YOUR TEST Rybelsus (semaglutide) Adlyxin (lixisenatide) Victoza (liraglutide) Byetta (exanatide) ___________________________________________________________________________    Recommend starting on a fiber supplement, can try metamucil first but if this causes gas/bloating switch to benefiber or citracel, these do not cause gas.  Take with fiber with with a full 8 oz glass of water once a day. This can take 1 month to start helping, so try for at least one month.  Recommend increasing water and physical activity.   - Drink at least 64-80 ounces of water/liquid per day. - Establish a time to try to move your bowels every day.  For many people, this is after a cup of coffee or after a meal such as breakfast. - Sit all of the way back on the toilet keeping your back fairly straight and while sitting up, try to rest the tops of your forearms on your upper thighs.   - Raising your feet with a step stool/squatty potty can be helpful to improve the angle that allows your stool to pass through the rectum. - Relax the rectum feeling it bulge toward the toilet water.  If you feel your rectum raising toward your body, you are contracting rather than relaxing. - Breathe in and slowly exhale. Belly breath by expanding your belly towards your belly button. Keep belly expanded as you gently direct pressure down and back to the anus.  A low pitched GRRR sound can assist with increasing intra-abdominal pressure.  (Can also trying to blow on a pinwheel and make it move, this helps with the same belly  breathing) - Repeat 3-4 times. If unsuccessful, contract the pelvic floor to restore normal tone and get off the toilet.  Avoid excessive straining. - To reduce excessive wiping by teaching your anus to normally contract, place hands on outer aspect of knees and resist knee movement outward.  Hold 5-10 second then place hands just inside of knees and resist inward movement of knees.  Hold 5 seconds.  Repeat a few times each way.  Go to the ER if unable to pass gas, severe AB pain, unable to hold down food, any shortness of breath of chest pain.  Thank you for trusting me with your gastrointestinal care!   Alan Coombs, PA-C  _______________________________________________________  If your blood pressure at your visit was 140/90 or greater, please contact your primary care physician to follow up on this.  _______________________________________________________  If you are age 18 or older, your body mass index should be between 23-30. Your Body mass index is 26.06 kg/m. If this is out of the aforementioned range listed, please consider follow up with your Primary Care Provider.  If you are age 89 or younger, your body mass index should be between 19-25. Your Body mass index is 26.06 kg/m. If this is out of the aformentioned range listed, please consider follow up with your Primary Care Provider.   ________________________________________________________  The [facility] providers would like to encourage you to use MYCHART to communicate with providers for non-urgent requests or questions.  Due to long hold times on the telephone, sending your provider a message by  MYCHART may be a faster and more efficient way to get a response.  Please allow 48 business hours for a response.  Please remember that this is for non-urgent requests.  _______________________________________________________  [facility] is using a team-based approach to care.  Your team is made up of your doctor and  two to three APPS. Our APPS (Nurse Practitioners and Physician Assistants) work with your physician to ensure care continuity for you. They are fully qualified to address your health concerns and develop a treatment plan. They communicate directly with your gastroenterologist to care for you. Seeing the Advanced Practice Practitioners on your physician's team can help you by facilitating care more promptly, often allowing for earlier appointments, access to diagnostic testing, procedures, and other specialty referrals.

## 2024-05-31 ENCOUNTER — Ambulatory Visit: Admitting: Family Medicine

## 2024-06-08 ENCOUNTER — Encounter: Payer: Self-pay | Admitting: Family Medicine

## 2024-06-08 ENCOUNTER — Ambulatory Visit (INDEPENDENT_AMBULATORY_CARE_PROVIDER_SITE_OTHER): Admitting: Family Medicine

## 2024-06-08 VITALS — BP 130/84 | HR 70 | Temp 97.7°F | Ht 72.0 in | Wt 190.9 lb

## 2024-06-08 DIAGNOSIS — Z7689 Persons encountering health services in other specified circumstances: Secondary | ICD-10-CM | POA: Diagnosis not present

## 2024-06-08 DIAGNOSIS — K625 Hemorrhage of anus and rectum: Secondary | ICD-10-CM | POA: Diagnosis not present

## 2024-06-08 DIAGNOSIS — Z23 Encounter for immunization: Secondary | ICD-10-CM

## 2024-06-08 NOTE — Patient Instructions (Signed)
 Healthy Heart:  Recommend heart healthy/Mediterranean diet, with whole grains, fruits, vegetable, fish, lean meats, nuts, and olive oil. Limit salt. Recommend moderate walking, 3-5 times/week for 30-50 minutes each session. Aim for at least 150 minutes.week. Goal should be pace of 3 miles/hours, or walking 1.5 miles in 30 minutes Recommend avoidance of tobacco products. Avoid excess alcohol.   Omron is a good brand.  Taking your blood pressure the proper way is important to ensure an accurate reading. Purchase a cuff that fits your arm size appropriately. Sit with your back against the chair and feet on the floor without your legs crossed.  Sit quietly for at least 5 minutes, (10 minutes is best)  before you start to measure your blood pressure. Prop your arm so that the arm is resting at your heart level. Record the reading to bring to your next follow-up visit.

## 2024-06-08 NOTE — Progress Notes (Signed)
 New Patient Office Visit  Subjective    Patient ID: Walter Abbott, male    DOB: 11-Feb-1971  Age: 53 y.o. MRN: [id number]  CC:  Chief Complaint  Patient presents with   Establish Care    HPI Walter Abbott presents to establish care with this practice. He is new to me  Has not been to PCP since before Covid.  Recently went to urgent with rectal bleeding. Has seen GI on 05/29/24, note reviewed.  Has colonoscopy scheduled 06/29/24.   Mental Health needs are met by behavioral health provider.  Influenza vaccine today.   Will schedule CPE with labs soon.  Follow along with GI.   Chart review: 05/12/24: urgent care visit reviewed.  05/12/24 labs reviewed:  CBC: normal CMP: glucose low, otherwise normal. Lipase 50    Outpatient Encounter Medications as of 06/08/2024  Medication Sig   ALPRAZolam  (XANAX ) 0.5 MG tablet Take 1 tablet (0.5 mg total) by mouth 3 (three) times daily as needed for anxiety.   FLUoxetine  (PROZAC ) 40 MG capsule Take 1 capsule (40 mg total) by mouth daily.   Ascorbic Acid (VITAMIN C PO) Take 1 tablet by mouth daily.   (Patient not taking: Reported on 06/08/2024)   Multiple Vitamin (MULITIVITAMIN WITH MINERALS) TABS Take 1 tablet by mouth daily.   (Patient not taking: Reported on 06/08/2024)   No facility-administered encounter medications on file as of 06/08/2024.    Past Medical History:  Diagnosis Date   Anxiety    Rectal bleeding     History reviewed. No pertinent surgical history.  Family History  Problem Relation Age of Onset   Brain cancer Mother    Neuropathy Father    Peptic Ulcer Father    Colon cancer Father     Social History   Socioeconomic History   Marital status: Married    Spouse name: Not on file   Number of children: 2   Years of education: Not on file   Highest education level: Bachelor's degree (e.g., BA, AB, BS)  Occupational History   Not on file  Tobacco Use   Smoking status: Never   Smokeless  tobacco: Never  Vaping Use   Vaping status: Former  Substance and Sexual Activity   Alcohol use: Yes   Drug use: Never   Sexual activity: Not on file  Other Topics Concern   Not on file  Social History Narrative   Not on file   Social Drivers of Health   Financial Resource Strain: Low Risk  (06/04/2024)   Overall Financial Resource Strain (CARDIA)    Difficulty of Paying Living Expenses: Not very hard  Food Insecurity: No Food Insecurity (06/04/2024)   Hunger Vital Sign    Worried About Running Out of Food in the Last Year: Never true    Ran Out of Food in the Last Year: Never true  Transportation Needs: No Transportation Needs (06/04/2024)   PRAPARE - Administrator, Civil Service (Medical): No    Lack of Transportation (Non-Medical): No  Physical Activity: Insufficiently Active (06/04/2024)   Exercise Vital Sign    Days of Exercise per Week: 1 day    Minutes of Exercise per Session: 30 min  Stress: No Stress Concern Present (06/04/2024)   [employer] of Occupational Health - Occupational Stress Questionnaire    Feeling of Stress: Only a little  Social Connections: Moderately Isolated (06/04/2024)   Social Connection and Isolation Panel    Frequency of Communication with Friends  and Family: Once a week    Frequency of Social Gatherings with Friends and Family: Once a week    Attends Religious Services: More than 4 times per year    Active Member of [employer] or Organizations: No    Attends Banker Meetings: Not on file    Marital Status: Married  Intimate Partner Violence: Unknown (09/15/2022)   Received from [facility]   HITS    Physically Hurt: Not on file    Insult or Talk Down To: Not on file    Threaten Physical Harm: Not on file    Scream or Curse: Not on file    ROS      Objective    BP 130/84 (Cuff Size: Normal)   Pulse 70   Temp 97.7 F (36.5 C) (Oral)   Ht 6' (1.829 m)   Wt 190 lb 14.4 oz (86.6 kg)   SpO2 99%   BMI  25.89 kg/m   Physical Exam Vitals and nursing note reviewed.  Constitutional:      General: He is not in acute distress.    Appearance: Normal appearance.  Cardiovascular:     Rate and Rhythm: Normal rate and regular rhythm.     Heart sounds: Normal heart sounds.  Pulmonary:     Effort: Pulmonary effort is normal.     Breath sounds: Normal breath sounds.  Skin:    General: Skin is warm and dry.  Neurological:     General: No focal deficit present.     Mental Status: He is alert. Mental status is at baseline.  Psychiatric:        Mood and Affect: Mood normal.        Behavior: Behavior normal.        Thought Content: Thought content normal.        Judgment: Judgment normal.         Assessment & Plan:   Problem List Items Addressed This Visit     Immunization due   Relevant Orders   Flu vaccine trivalent PF, 6mos and older(Flulaval,Afluria,Fluarix,Fluzone) (Completed)   Establishing care with new doctor, encounter for - Primary   Rectal bleeding  Agrees with plan of care discussed.  Questions answered.   Return in about 2 weeks (around 06/22/2024) for CPE with labs.   Darice JONELLE Brownie, FNP

## 2024-06-21 ENCOUNTER — Encounter: Payer: Self-pay | Admitting: Gastroenterology

## 2024-06-23 ENCOUNTER — Encounter: Admitting: Gastroenterology

## 2024-06-29 ENCOUNTER — Encounter: Payer: Self-pay | Admitting: Gastroenterology

## 2024-06-29 ENCOUNTER — Ambulatory Visit (AMBULATORY_SURGERY_CENTER): Admitting: Gastroenterology

## 2024-06-29 VITALS — BP 124/86 | HR 82 | Temp 97.5°F | Resp 12 | Ht 72.0 in | Wt 192.2 lb

## 2024-06-29 DIAGNOSIS — Z800 Family history of malignant neoplasm of digestive organs: Secondary | ICD-10-CM | POA: Diagnosis not present

## 2024-06-29 DIAGNOSIS — Z1211 Encounter for screening for malignant neoplasm of colon: Secondary | ICD-10-CM

## 2024-06-29 DIAGNOSIS — K640 First degree hemorrhoids: Secondary | ICD-10-CM | POA: Diagnosis not present

## 2024-06-29 DIAGNOSIS — K625 Hemorrhage of anus and rectum: Secondary | ICD-10-CM

## 2024-06-29 MED ORDER — SODIUM CHLORIDE 0.9 % IV SOLN: 500.0000 mL | Freq: Once | INTRAVENOUS | Status: DC

## 2024-06-29 MED ORDER — HYDROCORTISONE (PERIANAL) 2.5 % EX CREA: TOPICAL_CREAM | CUTANEOUS | 1 refills | Status: AC

## 2024-06-29 NOTE — Patient Instructions (Signed)
 USE HC Cream 2.5%:  Apply externally twice daily for 7 days.  Prescription has been sent to your preferred pharmacy.   YOU HAD AN ENDOSCOPIC PROCEDURE TODAY AT THE [facility]:   Refer to the procedure report that was given to you for any specific questions about what was found during the examination.  If the procedure report does not answer your questions, please call your gastroenterologist to clarify.  If you requested that your care partner not be given the details of your procedure findings, then the procedure report has been included in a sealed envelope for you to review at your convenience later.  YOU SHOULD EXPECT: Some feelings of bloating in the abdomen. Passage of more gas than usual.  Walking can help get rid of the air that was put into your GI tract during the procedure and reduce the bloating. If you had a lower endoscopy (such as a colonoscopy or flexible sigmoidoscopy) you may notice spotting of blood in your stool or on the toilet paper. If you underwent a bowel prep for your procedure, you may not have a normal bowel movement for a few days.  Please Note:  You might notice some irritation and congestion in your nose or some drainage.  This is from the oxygen used during your procedure.  There is no need for concern and it should clear up in a day or so.  SYMPTOMS TO REPORT IMMEDIATELY:  Following lower endoscopy (colonoscopy or flexible sigmoidoscopy):  Excessive amounts of blood in the stool  Significant tenderness or worsening of abdominal pains  Swelling of the abdomen that is new, acute  Fever of 100F or higher  For urgent or emergent issues, a gastroenterologist can be reached at any hour by calling [phone]. Do not use MyChart messaging for urgent concerns.    DIET:  We do recommend a small meal at first, but then you may proceed to your regular diet.  Drink plenty of fluids but you should avoid alcoholic beverages for 24 hours.  ACTIVITY:  You  should plan to take it easy for the rest of today and you should NOT DRIVE or use heavy machinery until tomorrow (because of the sedation medicines used during the test).    FOLLOW UP: Our staff will call the number listed on your records the next business day following your procedure.  We will call around 7:15- 8:00 am to check on you and address any questions or concerns that you may have regarding the information given to you following your procedure. If we do not reach you, we will leave a message.     If any biopsies were taken you will be contacted by phone or by letter within the next 1-3 weeks.  Please call us  at [phone] if you have not heard about the biopsies in 3 weeks.    SIGNATURES/CONFIDENTIALITY: You and/or your care partner have signed paperwork which will be entered into your electronic medical record.  These signatures attest to the fact that that the information above on your After Visit Summary has been reviewed and is understood.  Full responsibility of the confidentiality of this discharge information lies with you and/or your care-partner.

## 2024-06-29 NOTE — Progress Notes (Signed)
 Sedate, gd SR, tolerated procedure well, VSS, report to RN

## 2024-06-29 NOTE — Progress Notes (Signed)
 05/29/2024 Walter Abbott [id number] 1971/07/06   Referring provider: No ref. provider found Primary GI doctor: Dr. Charlanne   ASSESSMENT AND PLAN:  Rectal bleeding  after BM associated nocturnal, AB pain, multiple small stools, small volume BRB blood with Bm's Weight loss 7 lbs due to decreased appetitie during that time Urgent care visit 05/08/2024 CBC without anemia, lipase normal, CMET unremarkable Likely infectious versus constipation in Oct, hemorrhoids, rule out malignancy/IBD, Declines rectal Plan for colonoscopy at [facility], We have discussed the risks of bleeding, infection, perforation, medication reactions, and remote risk of death associated with colonoscopy. All questions were answered and the patient acknowledges these risk and wishes to proceed.   Father with colon cancer age 30 Has never had colon cancer Mom with autoimmune condition   Constipation BM every few days but soft - Increase fiber/ water intake, decrease caffeine, increase activity level.     Patient Care Team: Patient, No Pcp Per as PCP - General (General Practice)   HISTORY OF PRESENT ILLNESS: 53 y.o. male with a past medical history listed below presents for evaluation of rectal bleeding.    Discussed the use of AI scribe software for clinical note transcription with the patient, who gave verbal consent to proceed.   History of Present Illness   Walter Abbott is a 53 year old male who presents with rectal bleeding and abdominal pain.   In October, he experienced an episode of rectal bleeding. He woke up on a Friday night with the need to use the bathroom, had a bowel movement, and subsequently experienced significant pain. The pain persisted throughout the night, waking him hourly. He describes the pain as possibly abdominal or rectal, but he was unable to differentiate. By Saturday morning, his wife provided him with stool softeners, and he began drinking more water.   Initially, his bowel  movements were soft, but after taking stool softeners, he experienced diarrhea mixed with bright red blood, which continued through Sunday evening. By Monday, the diarrhea persisted but without bleeding. He had a normal bowel movement by the following Friday, prompting a visit to urgent care due to the unusual nature of the episode. He has not experienced any further episodes of rectal bleeding since then.   No upper gastrointestinal symptoms such as nausea, vomiting, heartburn, or trouble swallowing. He occasionally feels a sensation of food being 'stuck' or heaviness in his stomach after eating. He experienced a weight loss of about six to seven pounds during this period, attributed to a reduced intake of solid food and primarily consuming soup due to stomach discomfort. His weight has not returned to baseline.   Family history is significant for his father having colon cancer at age 22, treated with surgery and radiation, with stable CEA levels since. His mother had an unspecified autoimmune condition and brain cancer.           He  reports that he has never smoked. He has never used smokeless tobacco. He reports current alcohol use. He reports that he does not use drugs.   RELEVANT GI HISTORY, IMAGING AND LABS: Results            CBC Labs (Brief)          Component Value Date/Time    WBC 5.1 05/12/2024 1500    RBC 5.16 05/12/2024 1500    HGB 15.6 05/12/2024 1500    HCT 47.1 05/12/2024 1500    PLT 322 05/12/2024 1500    MCV 91.3 05/12/2024  1500    MCH 30.2 05/12/2024 1500    MCHC 33.1 05/12/2024 1500    RDW 12.3 05/12/2024 1500      Recent Labs (within last 365 days)     Recent Labs    05/12/24 1500  HGB 15.6        CMP     Labs (Brief)          Component Value Date/Time    NA 141 05/12/2024 1500    K 4.2 05/12/2024 1500    CL 101 05/12/2024 1500    CO2 26 05/12/2024 1500    GLUCOSE 58 (L) 05/12/2024 1500    BUN 7 05/12/2024 1500    CREATININE 0.90 05/12/2024  1500    CALCIUM 9.5 05/12/2024 1500    PROT 7.6 05/12/2024 1500    ALBUMIN 4.2 05/12/2024 1500    AST 36 05/12/2024 1500    ALT 36 05/12/2024 1500    ALKPHOS 49 05/12/2024 1500    BILITOT 0.4 05/12/2024 1500    GFRNONAA >60 05/12/2024 1500          Latest Ref Rng & Units 05/12/2024    3:00 PM  Hepatic Function  Total Protein 6.5 - 8.1 g/dL 7.6   Albumin 3.5 - 5.0 g/dL 4.2   AST 15 - 41 U/L 36   ALT 0 - 44 U/L 36   Alk Phosphatase 38 - 126 U/L 49   Total Bilirubin 0.0 - 1.2 mg/dL 0.4       Current Medications:              Current Outpatient Medications (Other):    ALPRAZolam  (XANAX ) 0.5 MG tablet, Take 1 tablet (0.5 mg total) by mouth 3 (three) times daily as needed for anxiety.   Ascorbic Acid (VITAMIN C PO), Take 1 tablet by mouth daily.     FLUoxetine  (PROZAC ) 40 MG capsule, Take 1 capsule (40 mg total) by mouth daily.   Multiple Vitamin (MULITIVITAMIN WITH MINERALS) TABS, Take 1 tablet by mouth daily.     Medical History:      Past Medical History:  Diagnosis Date   Anxiety          Allergies:  Allergies       Allergies  Allergen Reactions   Sulfa Antibiotics Other (See Comments)      Family history of sulfa allergy        Surgical History:  He  has no past surgical history on file. Family History:  His family history includes Brain cancer in his mother; Colon cancer in his father; Neuropathy in his father; Peptic Ulcer in his father.   REVIEW OF SYSTEMS  : All other systems reviewed and negative except where noted in the History of Present Illness.   PHYSICAL EXAM: BP 138/76 (BP Location: Left Arm, Patient Position: Sitting, Cuff Size: Normal)   Pulse 80   Ht 6' (1.829 m)   Wt 192 lb 2 oz (87.1 kg)   BMI 26.06 kg/m  Physical Exam   GENERAL APPEARANCE: Well nourished, in no apparent distress. HEENT: No cervical lymphadenopathy, unremarkable thyroid, sclerae anicteric, conjunctiva pink. RESPIRATORY: Respiratory effort normal, breath sounds  equal bilaterally without rales, rhonchi, or wheezing. CARDIO: Regular rate and rhythm with no murmurs, rubs, or gallops, peripheral pulses intact. ABDOMEN: Soft, non-distended, active bowel sounds in all four quadrants, slightly sensitive, uncomfortable, no rebound, no mass appreciated. RECTAL: Declines. MUSCULOSKELETAL: Full range of motion, normal gait, without edema. SKIN: Dry, intact without rashes or lesions.  No jaundice. NEURO: Alert, oriented, no focal deficits. PSYCH: Cooperative, normal mood and affect.       Alan JONELLE Coombs, PA-C   Attending physician's note   I have taken history, reviewed the chart and examined the patient. I performed a substantive portion of this encounter, including complete performance of at least one of the key components, in conjunction with the APP. I agree with the Advanced Practitioner's note, impression and recommendations.    Anselm Bring, MD [facility] [phone]

## 2024-06-29 NOTE — Op Note (Signed)
 [facility] Patient Name: Walter Abbott Procedure Date: 06/29/2024 1:35 PM MRN: [id number] Endoscopist: Lynnie Bring , MD, [id number] Age: 53 Referring MD:  Date of Birth: July 27, 1970 Gender: Male Account #: [id number] Procedure:                Colonoscopy Indications:              Screening in patient at increased risk: Colorectal                            cancer in father. Rectal bleeding is resolved. Medicines:                Monitored Anesthesia Care Procedure:                Pre-Anesthesia Assessment:                           - Prior to the procedure, a History and Physical                            was performed, and patient medications and                            allergies were reviewed. The patient's tolerance of                            previous anesthesia was also reviewed. The risks                            and benefits of the procedure and the sedation                            options and risks were discussed with the patient.                            All questions were answered, and informed consent                            was obtained. Prior Anticoagulants: The patient has                            taken no anticoagulant or antiplatelet agents. ASA                            Grade Assessment: I - A normal, healthy patient.                            After reviewing the risks and benefits, the patient                            was deemed in satisfactory condition to undergo the                            procedure.  After obtaining informed consent, the colonoscope                            was passed under direct vision. Throughout the                            procedure, the patient's blood pressure, pulse, and                            oxygen saturations were monitored continuously. The                            CF [id number] #[id number] was introduced through the anus                            and advanced to the 2  cm into the ileum. The                            colonoscopy was performed without difficulty. The                            patient tolerated the procedure well. The quality                            of the bowel preparation was good. The terminal                            ileum, ileocecal valve, appendiceal orifice, and                            rectum were photographed. Scope In: 1:36:28 PM Scope Out: 1:47:59 PM Scope Withdrawal Time: 0 hours 8 minutes 49 seconds  Total Procedure Duration: 0 hours 11 minutes 31 seconds  Findings:                 The colon (entire examined portion) appeared normal.                           Non-bleeding internal hemorrhoids were found during                            retroflexion. The hemorrhoids were small and Grade                            I (internal hemorrhoids that do not prolapse).                           The terminal ileum appeared normal.                           Retroflexion in the right colon was performed.                           The exam was otherwise without abnormality on  direct and retroflexion views. Complications:            No immediate complications. Estimated Blood Loss:     Estimated blood loss: none. Impression:               - The entire examined colon is normal.                           - Non-bleeding internal hemorrhoids.                           - The examined portion of the ileum was normal.                           - The examination was otherwise normal on direct                            and retroflexion views.                           - No specimens collected. Recommendation:           - Patient has a contact number available for                            emergencies. The signs and symptoms of potential                            delayed complications were discussed with the                            patient. Return to normal activities tomorrow.                             Written discharge instructions were provided to the                            patient.                           - High fiber diet.                           - Continue present medications.                           - Use HC Cream 2.5%: Apply externally BID for 7                            days. 2RF                           - Repeat colonoscopy in 5 years for screening                            purposes. Earlier, if with any new problems or  change in family history.                           - The findings and recommendations were discussed                            with the patient's family. Lynnie Bring, MD 06/29/2024 1:52:32 PM This report has been signed electronically.

## 2024-06-30 ENCOUNTER — Telehealth: Payer: Self-pay

## 2024-06-30 NOTE — Telephone Encounter (Signed)
°  Follow up Call-     06/29/2024   12:43 PM  Call back number  Post procedure Call Back phone  # [phone]  Permission to leave phone message Yes     Patient questions:  Do you have a fever, pain , or abdominal swelling? No. Pain Score  0 *  Have you tolerated food without any problems? Yes.    Have you been able to return to your normal activities? Yes.    Do you have any questions about your discharge instructions: Diet   No. Medications  No. Follow up visit  No.  Do you have questions or concerns about your Care? No.  Actions: * If pain score is 4 or above: No action needed, pain <4.  Confirmed with patient during call back that father had prostate cancer, not colon cancer. To his knowledge, no one in his family has had colon cancer. Updated records and notified Dr. Charlanne.

## 2024-07-03 ENCOUNTER — Encounter: Payer: Self-pay | Admitting: Family Medicine

## 2024-07-03 ENCOUNTER — Ambulatory Visit: Admitting: Family Medicine

## 2024-07-03 VITALS — BP 118/78 | HR 92 | Temp 98.1°F | Ht 72.0 in | Wt 190.7 lb

## 2024-07-03 DIAGNOSIS — Z13228 Encounter for screening for other metabolic disorders: Secondary | ICD-10-CM | POA: Diagnosis not present

## 2024-07-03 DIAGNOSIS — Z0000 Encounter for general adult medical examination without abnormal findings: Secondary | ICD-10-CM | POA: Diagnosis not present

## 2024-07-03 DIAGNOSIS — Z1159 Encounter for screening for other viral diseases: Secondary | ICD-10-CM | POA: Diagnosis not present

## 2024-07-03 DIAGNOSIS — Z13220 Encounter for screening for lipoid disorders: Secondary | ICD-10-CM

## 2024-07-03 DIAGNOSIS — Z136 Encounter for screening for cardiovascular disorders: Secondary | ICD-10-CM | POA: Diagnosis not present

## 2024-07-03 NOTE — Progress Notes (Signed)
 Complete physical exam  Patient: Walter Abbott   DOB: [birth year]   [name]  MRN: [id number]  Subjective:    Chief Complaint  Patient presents with   Annual Exam    Walter Abbott is a 53 y.o. male who presents today for a complete physical exam. He reports consuming a general diet. Walking 3 times per week for 20 minutes He generally feels well. He reports sleeping well. He does not have additional problems to discuss today.    Most recent fall risk assessment:    07/03/2024    8:29 AM  Fall Risk   Falls in the past year? 0  Number falls in past yr: 0  Injury with Fall? 0  Risk for fall due to : No Fall Risks  Follow up Education provided     Most recent depression screenings:    07/03/2024    8:29 AM 06/08/2024    9:38 AM  PHQ 2/9 Scores  PHQ - 2 Score 0 0  PHQ- 9 Score 0 0    Vision:Not within last year  and Dental: No current dental problems and No regular dental care     Patient Care Team: Booker Darice SAUNDERS, FNP as PCP - General (Family Medicine)   Show/hide medication list[1]  ROS        Objective:     BP 118/78 (Patient Position: Sitting, Cuff Size: Normal)   Pulse 92   Temp 98.1 F (36.7 C) (Oral)   Ht 6' (1.829 m)   Wt 190 lb 11.2 oz (86.5 kg)   SpO2 98%   BMI 25.86 kg/m    Physical Exam Vitals and nursing note reviewed.  Constitutional:      General: He is not in acute distress.    Appearance: Normal appearance.  HENT:     Right Ear: Tympanic membrane normal.     Left Ear: Tympanic membrane normal.     Nose: Nose normal.     Mouth/Throat:     Mouth: Mucous membranes are moist.     Pharynx: Oropharynx is clear.  Eyes:     Extraocular Movements: Extraocular movements intact.  Neck:     Thyroid: No thyroid tenderness.  Cardiovascular:     Rate and Rhythm: Normal rate and regular rhythm.     Pulses:          Radial pulses are 2+ on the right side and 2+ on the left side.     Heart sounds: Normal heart sounds, S1  normal and S2 normal.  Pulmonary:     Effort: Pulmonary effort is normal.     Breath sounds: Normal breath sounds.  Abdominal:     General: Bowel sounds are normal.     Palpations: Abdomen is soft.     Tenderness: There is no abdominal tenderness.  Musculoskeletal:        General: Normal range of motion.     Cervical back: Normal range of motion.     Right lower leg: No edema.     Left lower leg: No edema.  Lymphadenopathy:     Cervical:     Right cervical: No superficial cervical adenopathy.    Left cervical: No superficial cervical adenopathy.  Skin:    General: Skin is warm and dry.  Neurological:     General: No focal deficit present.     Mental Status: He is alert. Mental status is at baseline.  Psychiatric:        Mood  and Affect: Mood normal.        Behavior: Behavior normal.        Thought Content: Thought content normal.        Judgment: Judgment normal.      No results found for any visits on 07/03/24.     Assessment & Plan:    Routine Health Maintenance and Physical Exam  Immunization History  Administered Date(s) Administered   Influenza, Seasonal, Injecte, Preservative Fre 06/08/2024   Tdap 10/26/2016    Health Maintenance  Topic Date Due   HIV Screening  Never done   Hepatitis C Screening  Never done   COVID-19 Vaccine (1 - 2025-26 season) 07/19/2024 (Originally 03/20/2024)   Zoster Vaccines- Shingrix (1 of 2) 09/08/2024 (Originally 04/29/2023)   Pneumococcal Vaccine: 50+ Years (1 of 1 - PCV) 06/08/2025 (Originally 04/29/2023)   Hepatitis B Vaccines 19-59 Average Risk (1 of 3 - 19+ 3-dose series) 07/03/2025 (Originally 04/28/1992)   DTaP/Tdap/Td (2 - Td or Tdap) 10/27/2026   Colonoscopy  06/29/2034   Influenza Vaccine  Completed   HPV VACCINES  Aged Out   Meningococcal B Vaccine  Aged Out    Discussed health benefits of physical activity, and encouraged him to engage in regular exercise appropriate for his age and condition.  Annual physical  exam -     Hemoglobin A1c -     Lipid panel -     TSH + free T4 -     Hepatitis C antibody -     HIV Antibody (routine testing w rflx)  Encounter for lipid screening for cardiovascular disease -     Lipid panel  Encounter for screening for metabolic disorder -     Hemoglobin A1c -     TSH + free T4  Screening for viral disease -     Hepatitis C antibody -     HIV Antibody (routine testing w rflx)      Routine labs ordered.  HCM reviewed/discussed. Hep C and HIV today.  Anticipatory guidance regarding healthy weight, lifestyle and choices given. Recommend healthy diet.  Recommend approximately 150 minutes/week of moderate intensity exercise. Resistance training is good for building muscles and for bone health. Muscle mass helps to increase our metabolism and to burn more calories at rest.  Limit alcohol consumption: no more than one drink per day for women and 2 drinks per day for me. Recommend regular dental and vision exams. Always use seatbelt/lap and shoulder restraints. Recommend using smoke alarms and checking batteries at least twice a year. Recommend using sunscreen when outside.  Agrees with plan of care discussed.  Questions answered.      Return in about 1 year (around 07/04/2025) for CPE with labs.     Darice JONELLE Brownie, FNP     [1]  Outpatient Medications Prior to Visit  Medication Sig   ALPRAZolam  (XANAX ) 0.5 MG tablet Take 1 tablet (0.5 mg total) by mouth 3 (three) times daily as needed for anxiety.   FLUoxetine  (PROZAC ) 40 MG capsule Take 1 capsule (40 mg total) by mouth daily.   hydrocortisone  (ANUSOL -HC) 2.5 % rectal cream Apply externally twice daily for 7 days.   Ascorbic Acid (VITAMIN C PO) Take 1 tablet by mouth daily.   (Patient not taking: Reported on 07/03/2024)   Multiple Vitamin (MULITIVITAMIN WITH MINERALS) TABS Take 1 tablet by mouth daily.   (Patient not taking: Reported on 07/03/2024)   No facility-administered medications prior to visit.

## 2024-07-04 ENCOUNTER — Ambulatory Visit: Payer: Self-pay | Admitting: Family Medicine

## 2024-07-04 DIAGNOSIS — E782 Mixed hyperlipidemia: Secondary | ICD-10-CM | POA: Insufficient documentation

## 2024-07-04 LAB — LIPID PANEL
Chol/HDL Ratio: 5.5 ratio — ABNORMAL HIGH (ref 0.0–5.0)
Cholesterol, Total: 231 mg/dL — ABNORMAL HIGH (ref 100–199)
HDL: 42 mg/dL (ref >39)
LDL Chol Calc (NIH): 114 mg/dL — ABNORMAL HIGH (ref 0–99)
Triglycerides: 430 mg/dL — ABNORMAL HIGH (ref 0–149)
VLDL Cholesterol Cal: 75 mg/dL — ABNORMAL HIGH (ref 5–40)

## 2024-07-04 LAB — HEPATITIS C ANTIBODY: Hep C Virus Ab: NONREACTIVE

## 2024-07-04 LAB — TSH+FREE T4
Free T4: 1.14 ng/dL (ref 0.82–1.77)
TSH: 1.47 u[IU]/mL (ref 0.450–4.500)

## 2024-07-04 LAB — HEMOGLOBIN A1C
Est. average glucose Bld gHb Est-mCnc: 117 mg/dL
Hgb A1c MFr Bld: 5.7 % — ABNORMAL HIGH (ref 4.8–5.6)

## 2024-07-04 LAB — HIV ANTIBODY (ROUTINE TESTING W REFLEX): HIV Screen 4th Generation wRfx: NONREACTIVE

## 2024-07-04 MED ORDER — ROSUVASTATIN CALCIUM 5 MG PO TABS: 5.0000 mg | ORAL_TABLET | Freq: Every day | ORAL | 3 refills | Status: AC

## 2024-09-11 ENCOUNTER — Telehealth: Admitting: Adult Health
